# Patient Record
Sex: Female | Born: 1965 | Race: Black or African American | Hispanic: No | Smoking: Never smoker
Health system: Southern US, Community
[De-identification: ages and names within clinical notes are randomized; demographics above are authoritative.]

## PROBLEM LIST (undated history)

## (undated) DIAGNOSIS — T7840XA Allergy, unspecified, initial encounter: Secondary | ICD-10-CM

## (undated) HISTORY — PX: ADENOIDECTOMY: SUR15

## (undated) HISTORY — PX: TONSILLECTOMY: SUR1361

## (undated) HISTORY — DX: Allergy, unspecified, initial encounter: T78.40XA

---

## 1998-02-13 ENCOUNTER — Encounter (HOSPITAL_COMMUNITY): Admission: RE | Admit: 1998-02-13 | Discharge: 1998-03-10 | Payer: Self-pay | Admitting: Obstetrics and Gynecology

## 1998-03-06 ENCOUNTER — Inpatient Hospital Stay (HOSPITAL_COMMUNITY): Admission: AD | Admit: 1998-03-06 | Discharge: 1998-03-09 | Payer: Self-pay | Admitting: Obstetrics and Gynecology

## 1998-03-14 ENCOUNTER — Encounter: Admission: RE | Admit: 1998-03-14 | Discharge: 1998-06-12 | Payer: Self-pay | Admitting: Pediatrics

## 1998-07-14 ENCOUNTER — Encounter (HOSPITAL_COMMUNITY): Admission: RE | Admit: 1998-07-14 | Discharge: 1998-08-11 | Payer: Self-pay | Admitting: *Deleted

## 1999-07-07 ENCOUNTER — Ambulatory Visit (HOSPITAL_COMMUNITY): Admission: RE | Admit: 1999-07-07 | Discharge: 1999-07-07 | Payer: Self-pay | Admitting: Emergency Medicine

## 1999-07-09 ENCOUNTER — Encounter: Payer: Self-pay | Admitting: Emergency Medicine

## 2001-02-15 ENCOUNTER — Other Ambulatory Visit: Admission: RE | Admit: 2001-02-15 | Discharge: 2001-02-15 | Payer: Self-pay | Admitting: Gynecology

## 2002-03-23 ENCOUNTER — Other Ambulatory Visit: Admission: RE | Admit: 2002-03-23 | Discharge: 2002-03-23 | Payer: Self-pay | Admitting: Gynecology

## 2003-03-25 ENCOUNTER — Other Ambulatory Visit: Admission: RE | Admit: 2003-03-25 | Discharge: 2003-03-25 | Payer: Self-pay | Admitting: Gynecology

## 2003-11-13 ENCOUNTER — Encounter: Admission: RE | Admit: 2003-11-13 | Discharge: 2003-11-13 | Payer: Self-pay | Admitting: Gynecology

## 2004-12-02 ENCOUNTER — Other Ambulatory Visit: Admission: RE | Admit: 2004-12-02 | Discharge: 2004-12-02 | Payer: Self-pay | Admitting: Gynecology

## 2005-12-10 ENCOUNTER — Emergency Department (HOSPITAL_COMMUNITY): Admission: EM | Admit: 2005-12-10 | Discharge: 2005-12-11 | Payer: Self-pay | Admitting: Emergency Medicine

## 2005-12-15 ENCOUNTER — Other Ambulatory Visit: Admission: RE | Admit: 2005-12-15 | Discharge: 2005-12-15 | Payer: Self-pay | Admitting: Gynecology

## 2005-12-24 ENCOUNTER — Encounter: Admission: RE | Admit: 2005-12-24 | Discharge: 2005-12-24 | Payer: Self-pay | Admitting: Gynecology

## 2006-12-26 ENCOUNTER — Encounter: Admission: RE | Admit: 2006-12-26 | Discharge: 2006-12-26 | Payer: Self-pay | Admitting: Gynecology

## 2006-12-28 ENCOUNTER — Other Ambulatory Visit: Admission: RE | Admit: 2006-12-28 | Discharge: 2006-12-28 | Payer: Self-pay | Admitting: Gynecology

## 2007-12-29 ENCOUNTER — Encounter: Admission: RE | Admit: 2007-12-29 | Discharge: 2007-12-29 | Payer: Self-pay | Admitting: Gynecology

## 2009-01-15 ENCOUNTER — Encounter: Admission: RE | Admit: 2009-01-15 | Discharge: 2009-01-15 | Payer: Self-pay | Admitting: Gynecology

## 2010-01-12 ENCOUNTER — Encounter: Admission: RE | Admit: 2010-01-12 | Discharge: 2010-01-12 | Payer: Self-pay | Admitting: Gynecology

## 2011-02-10 ENCOUNTER — Other Ambulatory Visit: Payer: Self-pay | Admitting: Gynecology

## 2011-02-10 DIAGNOSIS — Z1231 Encounter for screening mammogram for malignant neoplasm of breast: Secondary | ICD-10-CM

## 2011-02-15 ENCOUNTER — Ambulatory Visit
Admission: RE | Admit: 2011-02-15 | Discharge: 2011-02-15 | Disposition: A | Discharge status: Home / Self Care | Payer: BC Managed Care – PPO | Source: Ambulatory Visit | Attending: Gynecology | Admitting: Gynecology

## 2011-02-15 DIAGNOSIS — Z1231 Encounter for screening mammogram for malignant neoplasm of breast: Secondary | ICD-10-CM

## 2012-01-05 ENCOUNTER — Other Ambulatory Visit: Payer: Self-pay | Admitting: Gynecology

## 2012-01-05 DIAGNOSIS — Z1231 Encounter for screening mammogram for malignant neoplasm of breast: Secondary | ICD-10-CM

## 2012-02-21 ENCOUNTER — Ambulatory Visit: Payer: BC Managed Care – PPO

## 2012-03-29 ENCOUNTER — Ambulatory Visit
Admission: RE | Admit: 2012-03-29 | Discharge: 2012-03-29 | Disposition: A | Discharge status: Home / Self Care | Payer: BC Managed Care – PPO | Source: Ambulatory Visit | Attending: Gynecology | Admitting: Gynecology

## 2012-03-29 DIAGNOSIS — Z1231 Encounter for screening mammogram for malignant neoplasm of breast: Secondary | ICD-10-CM

## 2013-04-10 ENCOUNTER — Other Ambulatory Visit: Payer: Self-pay

## 2013-04-10 DIAGNOSIS — Z1231 Encounter for screening mammogram for malignant neoplasm of breast: Secondary | ICD-10-CM

## 2013-04-18 ENCOUNTER — Ambulatory Visit
Admission: RE | Admit: 2013-04-18 | Discharge: 2013-04-18 | Disposition: A | Discharge status: Home / Self Care | Payer: BC Managed Care – PPO | Source: Ambulatory Visit

## 2013-04-18 DIAGNOSIS — Z1231 Encounter for screening mammogram for malignant neoplasm of breast: Secondary | ICD-10-CM

## 2013-11-26 HISTORY — PX: COLONOSCOPY: SHX174

## 2014-06-07 ENCOUNTER — Emergency Department (HOSPITAL_BASED_OUTPATIENT_CLINIC_OR_DEPARTMENT_OTHER)
Admission: EM | Admit: 2014-06-07 | Discharge: 2014-06-07 | Disposition: A | Discharge status: Home / Self Care | Payer: BC Managed Care – PPO | Attending: Emergency Medicine | Admitting: Emergency Medicine

## 2014-06-07 ENCOUNTER — Emergency Department (HOSPITAL_BASED_OUTPATIENT_CLINIC_OR_DEPARTMENT_OTHER): Payer: BC Managed Care – PPO

## 2014-06-07 ENCOUNTER — Encounter (HOSPITAL_BASED_OUTPATIENT_CLINIC_OR_DEPARTMENT_OTHER): Payer: Self-pay | Admitting: Emergency Medicine

## 2014-06-07 DIAGNOSIS — Z9889 Other specified postprocedural states: Secondary | ICD-10-CM | POA: Insufficient documentation

## 2014-06-07 DIAGNOSIS — Z3202 Encounter for pregnancy test, result negative: Secondary | ICD-10-CM | POA: Insufficient documentation

## 2014-06-07 DIAGNOSIS — R1031 Right lower quadrant pain: Secondary | ICD-10-CM | POA: Insufficient documentation

## 2014-06-07 DIAGNOSIS — R109 Unspecified abdominal pain: Secondary | ICD-10-CM

## 2014-06-07 LAB — PREGNANCY, URINE: Preg Test, Ur: NEGATIVE

## 2014-06-07 LAB — COMPREHENSIVE METABOLIC PANEL
ALT: 8 U/L (ref 0–35)
AST: 13 U/L (ref 0–37)
Albumin: 4.1 g/dL (ref 3.5–5.2)
Alkaline Phosphatase: 57 U/L (ref 39–117)
BILIRUBIN TOTAL: 0.4 mg/dL (ref 0.3–1.2)
BUN: 12 mg/dL (ref 6–23)
CHLORIDE: 103 meq/L (ref 96–112)
CO2: 24 meq/L (ref 19–32)
CREATININE: 0.8 mg/dL (ref 0.50–1.10)
Calcium: 9.5 mg/dL (ref 8.4–10.5)
GFR, EST NON AFRICAN AMERICAN: 87 mL/min — AB (ref >90)
GLUCOSE: 98 mg/dL (ref 70–99)
Potassium: 4.1 mEq/L (ref 3.7–5.3)
Sodium: 137 mEq/L (ref 137–147)
Total Protein: 7.7 g/dL (ref 6.0–8.3)

## 2014-06-07 LAB — CBC WITH DIFFERENTIAL/PLATELET
BASOS ABS: 0 10*3/uL (ref 0.0–0.1)
Basophils Relative: 0 % (ref 0–1)
Eosinophils Absolute: 0 10*3/uL (ref 0.0–0.7)
Eosinophils Relative: 0 % (ref 0–5)
HEMATOCRIT: 39.1 % (ref 36.0–46.0)
HEMOGLOBIN: 13.2 g/dL (ref 12.0–15.0)
LYMPHS ABS: 1.1 10*3/uL (ref 0.7–4.0)
LYMPHS PCT: 21 % (ref 12–46)
MCH: 28.6 pg (ref 26.0–34.0)
MCHC: 33.8 g/dL (ref 30.0–36.0)
MCV: 84.8 fL (ref 78.0–100.0)
MONO ABS: 0.4 10*3/uL (ref 0.1–1.0)
MONOS PCT: 7 % (ref 3–12)
NEUTROS ABS: 3.8 10*3/uL (ref 1.7–7.7)
Neutrophils Relative %: 72 % (ref 43–77)
Platelets: 214 10*3/uL (ref 150–400)
RBC: 4.61 MIL/uL (ref 3.87–5.11)
RDW: 12.6 % (ref 11.5–15.5)
WBC: 5.3 10*3/uL (ref 4.0–10.5)

## 2014-06-07 LAB — URINALYSIS, ROUTINE W REFLEX MICROSCOPIC
Bilirubin Urine: NEGATIVE
GLUCOSE, UA: NEGATIVE mg/dL
HGB URINE DIPSTICK: NEGATIVE
Ketones, ur: NEGATIVE mg/dL
LEUKOCYTES UA: NEGATIVE
Nitrite: NEGATIVE
PH: 5.5 (ref 5.0–8.0)
Protein, ur: NEGATIVE mg/dL
Specific Gravity, Urine: 1.024 (ref 1.005–1.030)
Urobilinogen, UA: 0.2 mg/dL (ref 0.0–1.0)

## 2014-06-07 LAB — LIPASE, BLOOD: Lipase: 28 U/L (ref 11–59)

## 2014-06-07 MED ORDER — IOHEXOL 300 MG/ML  SOLN
100.0000 mL | Freq: Once | INTRAMUSCULAR | Status: AC | PRN
  Administered 2014-06-07: 100 mL via INTRAVENOUS

## 2014-06-07 MED ORDER — SODIUM CHLORIDE 0.9 % IV BOLUS (SEPSIS)
1000.0000 mL | Freq: Once | INTRAVENOUS | Status: AC
  Administered 2014-06-07: 1000 mL via INTRAVENOUS

## 2014-06-07 MED ORDER — IOHEXOL 300 MG/ML  SOLN
50.0000 mL | Freq: Once | INTRAMUSCULAR | Status: AC | PRN
  Administered 2014-06-07: 50 mL via ORAL

## 2014-06-07 NOTE — ED Notes (Signed)
Right sided abdominal pain that started 0400 this am and progressively worsening throughout the day.  Nausea.  Denies fever, vomiting or diarrhea.

## 2014-06-07 NOTE — ED Provider Notes (Signed)
CSN: [id number]     Arrival date & time 06/07/14  1113 History   First MD Initiated Contact with Patient 06/07/14 1151     Chief Complaint  Patient presents with  . Abdominal Pain     (Consider location/radiation/quality/duration/timing/severity/associated sxs/prior Treatment) HPI Comments: Patient is a 48 year old female with no significant past medical history. She presents with complaints of pain in the right lower quadrant that started in the night and has worsened throughout the day. She denies any fevers or chills. She feels nauseated but denies vomiting. She denies diarrhea or constipation. Her last menstrual period was 2 months ago, however that is not abnormal for her. She denies the possibility of pregnancy.  Patient is a 48 y.o. female presenting with abdominal pain. The history is provided by the patient.  Abdominal Pain Pain location:  RLQ Pain quality: cramping   Pain radiates to:  Does not radiate Pain severity:  Moderate Onset quality:  Gradual Duration:  12 hours Timing:  Constant Progression:  Worsening Chronicity:  New Relieved by:  Nothing Worsened by:  Movement and palpation   History reviewed. No pertinent past medical history. Past Surgical History  Procedure Laterality Date  . Cesarean section    . Colonoscopy  11-2013    normal   No family history on file. History  Substance Use Topics  . Smoking status: Never Smoker   . Smokeless tobacco: Not on file  . Alcohol Use: No   OB History   Grav Para Term Preterm Abortions TAB SAB Ect Mult Living                 Review of Systems  Gastrointestinal: Positive for abdominal pain.  All other systems reviewed and are negative.     Allergies  Shellfish allergy  Home Medications   Prior to Admission medications   Not on File   BP 142/86  Pulse 81  Temp(Src) 98 F (36.7 C) (Oral)  Resp 20  Ht 4\' 11"  (1.499 m)  Wt 116 lb (52.617 kg)  BMI 23.42 kg/m2  SpO2 100%  LMP 05/07/2014 Physical  Exam  Nursing note and vitals reviewed. Constitutional: She is oriented to person, place, and time. She appears well-developed and well-nourished. No distress.  HENT:  Head: Normocephalic and atraumatic.  Neck: Normal range of motion. Neck supple.  Cardiovascular: Normal rate and regular rhythm.  Exam reveals no gallop and no friction rub.   No murmur heard. Pulmonary/Chest: Effort normal and breath sounds normal. No respiratory distress. She has no wheezes.  Abdominal: Soft. Bowel sounds are normal. She exhibits no distension. There is tenderness. There is no rebound and no guarding.  There is tenderness to palpation in the right lower quadrant with no rebound and no guarding. Bowel sounds are present.  Musculoskeletal: Normal range of motion.  Neurological: She is alert and oriented to person, place, and time.  Skin: Skin is warm and dry. She is not diaphoretic.    ED Course  Procedures (including critical care time) Labs Review Labs Reviewed  PREGNANCY, URINE  URINALYSIS, ROUTINE W REFLEX MICROSCOPIC  CBC WITH DIFFERENTIAL  COMPREHENSIVE METABOLIC PANEL  LIPASE, BLOOD    Imaging Review No results found.   EKG Interpretation None      MDM   Final diagnoses:  None    Patient presents with complaints of right-sided abdominal pain that started early this a.m. She is tender to palpation in the right lower quadrant and right midabdomen. Laboratory studies showed no elevation of  white count and urinalysis is clear. Pregnancy test is negative. CT scan shows no evidence for any acute pathology. She states she is feeling better and has declined any pain medication. I am uncertain as to the exact etiology of her discomfort, however nothing appears emergent or surgical. She will be discharged with instructions to return if she develops worsening pain, high fever, bloody stool, or any other new symptoms.    Geoffery Lyonsouglas Usiel Astarita, MD 06/07/14 1440

## 2014-06-07 NOTE — ED Notes (Signed)
Pt is drinking PO contrast.  Family at bedside.

## 2014-06-07 NOTE — Discharge Instructions (Signed)
Ibuprofen 400 mg every 6 hours as needed for pain.  Return to the emergency department if you develop worsening pain, high fever, bloody stool, or other new and concerning symptoms.   Abdominal Pain, Adult Many things can cause abdominal pain. Usually, abdominal pain is not caused by a disease and will improve without treatment. It can often be observed and treated at home. Your health care provider will do a physical exam and possibly order blood tests and X-rays to help determine the seriousness of your pain. However, in many cases, more time must pass before a clear cause of the pain can be found. Before that point, your health care provider may not know if you need more testing or further treatment. HOME CARE INSTRUCTIONS  Monitor your abdominal pain for any changes. The following actions may help to alleviate any discomfort you are experiencing:  Only take over-the-counter or prescription medicines as directed by your health care provider.  Do not take laxatives unless directed to do so by your health care provider.  Try a clear liquid diet (broth, tea, or water) as directed by your health care provider. Slowly move to a bland diet as tolerated. SEEK MEDICAL CARE IF:  You have unexplained abdominal pain.  You have abdominal pain associated with nausea or diarrhea.  You have pain when you urinate or have a bowel movement.  You experience abdominal pain that wakes you in the night.  You have abdominal pain that is worsened or improved by eating food.  You have abdominal pain that is worsened with eating fatty foods. SEEK IMMEDIATE MEDICAL CARE IF:   Your pain does not go away within 2 hours.  You have a fever.  You keep throwing up (vomiting).  Your pain is felt only in portions of the abdomen, such as the right side or the left lower portion of the abdomen.  You pass bloody or black tarry stools. MAKE SURE YOU:  Understand these instructions.   Will watch your  condition.   Will get help right away if you are not doing well or get worse.  Document Released: 09/22/2005 Document Revised: 10/03/2013 Document Reviewed: 08/22/2013 [facility] Patient Information 2014 [employer], [place].

## 2016-02-27 ENCOUNTER — Other Ambulatory Visit (HOSPITAL_COMMUNITY): Payer: Self-pay | Admitting: Otolaryngology

## 2016-02-27 DIAGNOSIS — R599 Enlarged lymph nodes, unspecified: Secondary | ICD-10-CM

## 2016-03-01 ENCOUNTER — Ambulatory Visit (HOSPITAL_COMMUNITY)
Admission: RE | Admit: 2016-03-01 | Discharge: 2016-03-01 | Disposition: A | Discharge status: Home / Self Care | Payer: BC Managed Care – PPO | Source: Ambulatory Visit | Attending: Otolaryngology | Admitting: Otolaryngology

## 2016-03-01 DIAGNOSIS — R591 Generalized enlarged lymph nodes: Secondary | ICD-10-CM | POA: Insufficient documentation

## 2016-03-01 DIAGNOSIS — R599 Enlarged lymph nodes, unspecified: Secondary | ICD-10-CM

## 2018-05-16 ENCOUNTER — Encounter (HOSPITAL_COMMUNITY): Payer: Self-pay | Admitting: Emergency Medicine

## 2018-05-16 ENCOUNTER — Ambulatory Visit (HOSPITAL_COMMUNITY)
Admission: EM | Admit: 2018-05-16 | Discharge: 2018-05-16 | Disposition: A | Discharge status: Home / Self Care | Payer: BC Managed Care – PPO | Attending: Family Medicine | Admitting: Family Medicine

## 2018-05-16 DIAGNOSIS — R0789 Other chest pain: Secondary | ICD-10-CM

## 2018-05-16 NOTE — ED Provider Notes (Signed)
[facility]    CSN: [id number] Arrival date & time: 05/16/18  1201     History   Chief Complaint Chief Complaint  Patient presents with  . Chest Pain    HPI Mary Golden is a 52 y.o. female no contributing past medical history presenting today for evaluation of chest discomfort.  Patient states that she began to have "pinging" sensations in her chest beginning this morning.  Pinging only lasted approximately 1 second, but continues to experience this frequently.  She also noted that she had some right arm numbness.  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  Does not take medication daily for any medical issues.  Denies history of hypertension and diabetes.  Denies family history of dying from early age from an MI.  She does note her neck is slightly stiff.  Drinks approximately half Cup of coffee every other day.  Denies associated changes in vision, nausea, vomiting, dizziness, shortness of breath.  Denies abdominal pain.  HPI  History reviewed. No pertinent past medical history.  There are no active problems to display for this patient.   Past Surgical History:  Procedure Laterality Date  . CESAREAN SECTION    . COLONOSCOPY  11-2013   normal    OB History   None      Home Medications    Prior to Admission medications   Not on File    Family History History reviewed. No pertinent family history.  Social History Social History   Tobacco Use  . Smoking status: Never Smoker  Substance Use Topics  . Alcohol use: No  . Drug use: No     Allergies   Shellfish allergy   Review of Systems Review of Systems  Constitutional: Negative for activity change, appetite change, fatigue and fever.  HENT: Negative for congestion and trouble swallowing.   Eyes: Negative for pain and visual disturbance.  Respiratory: Negative for cough and shortness of breath.   Cardiovascular: Positive for chest pain.  Gastrointestinal:  Negative for abdominal pain, nausea and vomiting.  Musculoskeletal: Positive for myalgias and neck pain. Negative for arthralgias, back pain, gait problem and neck stiffness.  Skin: Negative for color change and wound.  Neurological: Negative for dizziness, seizures, syncope, weakness, light-headedness, numbness and headaches.     Physical Exam Triage Vital Signs ED Triage Vitals [05/16/18 1247]  Enc Vitals Group     BP 121/79     Pulse Rate 87     Resp 18     Temp 98.5 F (36.9 C)     Temp Source Oral     SpO2 100 %     Weight      Height      Head Circumference      Peak Flow      Pain Score      Pain Loc      Pain Edu?      Excl. in GC?    No data found.  Updated Vital Signs BP 121/79 (BP Location: Left Arm)   Pulse 87   Temp 98.5 F (36.9 C) (Oral)   Resp 18   SpO2 100%   Visual Acuity Right Eye Distance:   Left Eye Distance:   Bilateral Distance:    Right Eye Near:   Left Eye Near:    Bilateral Near:     Physical Exam  Constitutional: She is oriented to person, place, and time. She appears well-developed and well-nourished. No distress.  HENT:  Head: Normocephalic and atraumatic.  Mouth/Throat: Oropharynx is clear and moist.  Eyes: Pupils are equal, round, and reactive to light. Conjunctivae and EOM are normal.  Neck: Neck supple.  Cardiovascular: Normal rate and regular rhythm.  No murmur heard. Pulmonary/Chest: Effort normal and breath sounds normal. No respiratory distress.  Breathing comfortably at rest, CTABL, no wheezing, rales or other adventitious sounds auscultated  discomfort not reproducible with palpation of chest  Abdominal: Soft. There is no tenderness.  Musculoskeletal: She exhibits no edema.  Neurological: She is alert and oriented to person, place, and time.  Skin: Skin is warm and dry.  Psychiatric: She has a normal mood and affect.  Nursing note and vitals reviewed.    UC Treatments / Results  Labs (all labs ordered are  listed, but only abnormal results are displayed) Labs Reviewed - No data to display  EKG None  Radiology No results found.  Procedures Procedures (including critical care time)  Medications Ordered in UC Medications - No data to display  Initial Impression / Assessment and Plan / UC Course  I have reviewed the triage vital signs and the nursing notes.  Pertinent labs & imaging results that were available during my care of the patient were reviewed by me and considered in my medical decision making (see chart for details).     Patient with atypical chest discomfort, EKG normal sinus rhythm, and no signs of ischemia or infarction.  Vital signs stable.  Patient low risk for MI.  Possibly related to PVCs although these were not seen on EKG.  Patient with not having pinging during EKG.  Do not feel patient needs emergent work-up of this at this time.  Discussed establishing care with a PCP.Discussed strict return precautions. Patient verbalized understanding and is agreeable with plan.  Final Clinical Impressions(s) / UC Diagnoses   Final diagnoses:  Atypical chest pain     Discharge Instructions     Please set up care with a PCP  Return if symptoms worsening or developing new symptoms   ED Prescriptions    None     Controlled Substance Prescriptions [place] Controlled Substance Registry consulted? Not Applicable   Lew Dawes, [place] 05/16/18 1404

## 2018-05-16 NOTE — ED Triage Notes (Signed)
Pt here for mid sternal CP intermittent x 1 week that was worse this morning

## 2018-05-16 NOTE — Discharge Instructions (Signed)
Please set up care with a PCP  Return if symptoms worsening or developing new symptoms

## 2019-07-30 LAB — HM MAMMOGRAPHY

## 2019-07-31 LAB — HM PAP SMEAR: HM Pap smear: NEGATIVE

## 2020-07-08 ENCOUNTER — Telehealth: Payer: Self-pay | Admitting: General Practice

## 2020-07-08 NOTE — Telephone Encounter (Signed)
Received online request to schedule new patient appt, prefers Alysia Penna on Friday morning. Left voicemail for patient to call the office to schedule.

## 2020-08-15 ENCOUNTER — Ambulatory Visit: Payer: BC Managed Care – PPO | Admitting: Nurse Practitioner

## 2020-08-26 ENCOUNTER — Other Ambulatory Visit: Payer: Self-pay

## 2020-08-26 ENCOUNTER — Ambulatory Visit (INDEPENDENT_AMBULATORY_CARE_PROVIDER_SITE_OTHER): Payer: BC Managed Care – PPO | Admitting: Nurse Practitioner

## 2020-08-26 ENCOUNTER — Encounter: Payer: Self-pay | Admitting: Nurse Practitioner

## 2020-08-26 VITALS — BP 120/86 | HR 84 | Temp 97.3°F | Ht 58.5 in | Wt 124.6 lb

## 2020-08-26 DIAGNOSIS — Z800 Family history of malignant neoplasm of digestive organs: Secondary | ICD-10-CM | POA: Insufficient documentation

## 2020-08-26 DIAGNOSIS — Z0000 Encounter for general adult medical examination without abnormal findings: Secondary | ICD-10-CM

## 2020-08-26 DIAGNOSIS — Z13220 Encounter for screening for lipoid disorders: Secondary | ICD-10-CM | POA: Diagnosis not present

## 2020-08-26 DIAGNOSIS — N951 Menopausal and female climacteric states: Secondary | ICD-10-CM | POA: Insufficient documentation

## 2020-08-26 DIAGNOSIS — Z1231 Encounter for screening mammogram for malignant neoplasm of breast: Secondary | ICD-10-CM | POA: Diagnosis not present

## 2020-08-26 DIAGNOSIS — Z91013 Allergy to seafood: Secondary | ICD-10-CM

## 2020-08-26 DIAGNOSIS — Z136 Encounter for screening for cardiovascular disorders: Secondary | ICD-10-CM | POA: Diagnosis not present

## 2020-08-26 LAB — COMPREHENSIVE METABOLIC PANEL
ALT: 13 U/L (ref 0–35)
AST: 17 U/L (ref 0–37)
Albumin: 4.5 g/dL (ref 3.5–5.2)
Alkaline Phosphatase: 67 U/L (ref 39–117)
BUN: 14 mg/dL (ref 6–23)
CO2: 29 mEq/L (ref 19–32)
Calcium: 9.7 mg/dL (ref 8.4–10.5)
Chloride: 104 mEq/L (ref 96–112)
Creatinine, Ser: 0.7 mg/dL (ref 0.40–1.20)
GFR: 106.15 mL/min (ref >60.00)
Glucose, Bld: 87 mg/dL (ref 70–99)
Potassium: 3.8 mEq/L (ref 3.5–5.1)
Sodium: 139 mEq/L (ref 135–145)
Total Bilirubin: 0.3 mg/dL (ref 0.2–1.2)
Total Protein: 7.7 g/dL (ref 6.0–8.3)

## 2020-08-26 LAB — CBC WITH DIFFERENTIAL/PLATELET
Basophils Absolute: 0 10*3/uL (ref 0.0–0.1)
Basophils Relative: 0.4 % (ref 0.0–3.0)
Eosinophils Absolute: 0.1 10*3/uL (ref 0.0–0.7)
Eosinophils Relative: 1.1 % (ref 0.0–5.0)
HCT: 40.5 % (ref 36.0–46.0)
Hemoglobin: 13 g/dL (ref 12.0–15.0)
Lymphocytes Relative: 35.6 % (ref 12.0–46.0)
Lymphs Abs: 1.6 10*3/uL (ref 0.7–4.0)
MCHC: 32.2 g/dL (ref 30.0–36.0)
MCV: 82.3 fl (ref 78.0–100.0)
Monocytes Absolute: 0.3 10*3/uL (ref 0.1–1.0)
Monocytes Relative: 6.8 % (ref 3.0–12.0)
Neutro Abs: 2.5 10*3/uL (ref 1.4–7.7)
Neutrophils Relative %: 56.1 % (ref 43.0–77.0)
Platelets: 223 10*3/uL (ref 150.0–400.0)
RBC: 4.92 Mil/uL (ref 3.87–5.11)
RDW: 13.8 % (ref 11.5–15.5)
WBC: 4.4 10*3/uL (ref 4.0–10.5)

## 2020-08-26 LAB — LIPID PANEL
Cholesterol: 198 mg/dL (ref 0–200)
HDL: 57.8 mg/dL (ref >39.00)
LDL Cholesterol: 121 mg/dL — ABNORMAL HIGH (ref 0–99)
NonHDL: 140.37
Total CHOL/HDL Ratio: 3
Triglycerides: 95 mg/dL (ref 0.0–149.0)
VLDL: 19 mg/dL (ref 0.0–40.0)

## 2020-08-26 LAB — TSH: TSH: 1.55 u[IU]/mL (ref 0.35–4.50)

## 2020-08-26 MED ORDER — EPINEPHRINE 0.3 MG/0.3ML IJ SOAJ: 0.3000 mg | INTRAMUSCULAR | 1 refills | Status: DC | PRN

## 2020-08-26 NOTE — Patient Instructions (Addendum)
Go to lab for blood draw. You will be contacted to schedule appt for mammogram  Please sign medical release forms to get records from GI and GYN.  Thank you for choosing [facility] for your health needs.   Preventive Care 52-52 Years Oldears Old, Female Preventive care refers to visits with your health care provider and lifestyle choices that can promote health and wellness. This includes:  A yearly physical exam. This may also be called an annual well check.  Regular dental visits and eye exams.  Immunizations.  Screening for certain conditions.  Healthy lifestyle choices, such as eating a healthy diet, getting regular exercise, not using drugs or products that contain nicotine and tobacco, and limiting alcohol use. What can I expect for my preventive care visit? Physical exam Your health care provider will check your:  Height and weight. This may be used to calculate body mass index (BMI), which tells if you are at a healthy weight.  Heart rate and blood pressure.  Skin for abnormal spots. Counseling Your health care provider may ask you questions about your:  Alcohol, tobacco, and drug use.  Emotional well-being.  Home and relationship well-being.  Sexual activity.  Eating habits.  Work and work Astronomer.  Method of birth control.  Menstrual cycle.  Pregnancy history. What immunizations do I need?  Influenza (flu) vaccine  This is recommended every year. Tetanus, diphtheria, and pertussis (Tdap) vaccine  You may need a Td booster every 10 years. Varicella (chickenpox) vaccine  You may need this if you have not been vaccinated. Zoster (shingles) vaccine  You may need this after age 52. Measles, mumps, and rubella (MMR) vaccine  You may need at least one dose of MMR if you were born in 1957 or later. You may also need a second dose. Pneumococcal conjugate (PCV13) vaccine  You may need this if you have certain conditions and were not previously  vaccinated. Pneumococcal polysaccharide (PPSV23) vaccine  You may need one or two doses if you smoke cigarettes or if you have certain conditions. Meningococcal conjugate (MenACWY) vaccine  You may need this if you have certain conditions. Hepatitis A vaccine  You may need this if you have certain conditions or if you travel or work in places where you may be exposed to hepatitis A. Hepatitis B vaccine  You may need this if you have certain conditions or if you travel or work in places where you may be exposed to hepatitis B. Haemophilus influenzae type b (Hib) vaccine  You may need this if you have certain conditions. Human papillomavirus (HPV) vaccine  If recommended by your health care provider, you may need three doses over 6 months. You may receive vaccines as individual doses or as more than one vaccine together in one shot (combination vaccines). Talk with your health care provider about the risks and benefits of combination vaccines. What tests do I need? Blood tests  Lipid and cholesterol levels. These may be checked every 5 years, or more frequently if you are over 52 years old.  Hepatitis C test.  Hepatitis B test. Screening  Lung cancer screening. You may have this screening every year starting at age 52 if you have a 30-pack-year history of smoking and currently smoke or have quit within the past 15 years.  Colorectal cancer screening. All adults should have this screening starting at age 52 and continuing until age 52. Your health care provider may recommend screening at age 90 if you are at increased risk.  You will have tests every 1-10 years, depending on your results and the type of screening test.  Diabetes screening. This is done by checking your blood sugar (glucose) after you have not eaten for a while (fasting). You may have this done every 1-3 years.  Mammogram. This may be done every 52-2 years. Talk with your health care provider about when you should start  having regular mammograms. This may depend on whether you have a family history of breast cancer.  BRCA-related cancer screening. This may be done if you have a family history of breast, ovarian, tubal, or peritoneal cancers.  Pelvic exam and Pap test. This may be done every 3 years starting at age 52. Starting at age 52, this may be done every 5 years if you have a Pap test in combination with an HPV test. Other tests  Sexually transmitted disease (STD) testing.  Bone density scan. This is done to screen for osteoporosis. You may have this scan if you are at high risk for osteoporosis. Follow these instructions at home: Eating and drinking  Eat a diet that includes fresh fruits and vegetables, whole grains, lean protein, and low-fat dairy.  Take vitamin and mineral supplements as recommended by your health care provider.  Do not drink alcohol if: ? Your health care provider tells you not to drink. ? You are pregnant, may be pregnant, or are planning to become pregnant.  If you drink alcohol: ? Limit how much you have to 0-1 drink a day. ? Be aware of how much alcohol is in your drink. In the [place] one drink equals one 12 oz bottle of beer (355 mL), one 5 oz glass of wine (148 mL), or one 1 oz glass of hard liquor (44 mL). Lifestyle  Take daily care of your teeth and gums.  Stay active. Exercise for at least 30 minutes on 5 or more days each week.  Do not use any products that contain nicotine or tobacco, such as cigarettes, e-cigarettes, and chewing tobacco. If you need help quitting, ask your health care provider.  If you are sexually active, practice safe sex. Use a condom or other form of birth control (contraception) in order to prevent pregnancy and STIs (sexually transmitted infections).  If told by your health care provider, take low-dose aspirin daily starting at age 14. What's next?  Visit your health care provider once a year for a well check visit.  Ask your health  care provider how often you should have your eyes and teeth checked.  Stay up to date on all vaccines. This information is not intended to replace advice given to you by your health care provider. Make sure you discuss any questions you have with your health care provider. Document Revised: 08/24/2018 Document Reviewed: 08/24/2018 Elsevier Patient Education  2020 [employer].

## 2020-08-26 NOTE — Progress Notes (Signed)
Subjective:    Patient ID: Mary Golden, female    DOB: 09/15/1966, 54 y.o.   MRN: [id number]  Patient presents today for CPE (new patient)  HPI  Sexual History (orientation,birth control, marital status, STD):divorced, not sexually active, up to date with pelvic exam, no hx of abnormal PAP, needs order for annual mammogram.  Depression/Suicide: no need for medication of counseling at this time. Depression screen PHQ 2/9 08/26/2020  Decreased Interest 1  Down, Depressed, Hopeless 1  PHQ - 2 Score 2  Altered sleeping 2  Tired, decreased energy 1  Change in appetite 0  Feeling bad or failure about yourself  0  Trouble concentrating 2  Moving slowly or fidgety/restless 0  Suicidal thoughts 0  PHQ-9 Score 7  Difficult doing work/chores Not difficult at all   GAD 7 : Generalized Anxiety Score 08/26/2020  Nervous, Anxious, on Edge 0  Control/stop worrying 0  Worry too much - different things 1  Trouble relaxing 0  Restless 0  Easily annoyed or irritable 0  Afraid - awful might happen 0  Total GAD 7 Score 1  Anxiety Difficulty Not difficult at all   Vision:up to date  Dental:up to date  Immunizations: (TDAP, Hep C screen, Pneumovax, Influenza, zoster)  Health Maintenance  Topic Date Due  .  Hepatitis C: One time screening is recommended by Center for Disease Control  (CDC) for  adults born from [birth year] through 1963.   Never done  . HIV Screening  Never done  . Tetanus Vaccine  Never done  . Pap Smear  Never done  . Mammogram  03/28/2018  . Colon Cancer Screening  Never done  . Flu Shot  07/27/2020  . COVID-19 Vaccine  Completed   Diet:heart healthy. Exercise: daily cardio  Weight:  Wt Readings from Last 3 Encounters:  08/26/20 124 lb 9.6 oz (56.5 kg)  06/07/14 116 lb (52.6 kg)   Fall Risk:no fall risk  Medications and allergies reviewed with patient and updated if appropriate.  There are no problems to display for this patient.   Current Outpatient  Medications on File Prior to Visit  Medication Sig Dispense Refill  . Multiple Vitamin (MULTIVITAMIN) tablet Take 1 tablet by mouth daily.     No current facility-administered medications on file prior to visit.    History reviewed. No pertinent past medical history.  Past Surgical History:  Procedure Laterality Date  . CESAREAN SECTION    . COLONOSCOPY  11-2013   normal    Social History   Socioeconomic History  . Marital status: Divorced    Spouse name: Not on file  . Number of children: 2  . Years of education: Not on file  . Highest education level: Not on file  Occupational History  . Not on file  Tobacco Use  . Smoking status: Never Smoker  . Smokeless tobacco: Never Used  Substance and Sexual Activity  . Alcohol use: No  . Drug use: No  . Sexual activity: Yes    Birth control/protection: Post-menopausal  Other Topics Concern  . Not on file  Social History Narrative  . Not on file   Social Determinants of Health   Financial Resource Strain:   . Difficulty of Paying Living Expenses: Not on file  Food Insecurity:   . Worried About Programme researcher, broadcasting/film/video in the Last Year: Not on file  . Ran Out of Food in the Last Year: Not on file  Transportation Needs:   . Lack  of Transportation (Medical): Not on file  . Lack of Transportation (Non-Medical): Not on file  Physical Activity:   . Days of Exercise per Week: Not on file  . Minutes of Exercise per Session: Not on file  Stress:   . Feeling of Stress : Not on file  Social Connections:   . Frequency of Communication with Friends and Family: Not on file  . Frequency of Social Gatherings with Friends and Family: Not on file  . Attends Religious Services: Not on file  . Active Member of Clubs or Organizations: Not on file  . Attends Banker Meetings: Not on file  . Marital Status: Not on file    Family History  Problem Relation Age of Onset  . Cancer Mother 66       colon  . Cancer Father         lung        Review of Systems  Constitutional: Negative for fever, malaise/fatigue and weight loss.  HENT: Negative for congestion and sore throat.   Eyes:       Negative for visual changes  Respiratory: Negative for cough and shortness of breath.   Cardiovascular: Negative for chest pain, palpitations and leg swelling.  Gastrointestinal: Negative for blood in stool, constipation, diarrhea and heartburn.  Genitourinary: Negative for dysuria, frequency and urgency.  Musculoskeletal: Negative for falls, joint pain and myalgias.  Skin: Negative for rash.  Neurological: Negative for dizziness, sensory change and headaches.  Endo/Heme/Allergies: Does not bruise/bleed easily.  Psychiatric/Behavioral: Negative for depression, substance abuse and suicidal ideas. The patient is not nervous/anxious.     Objective:   Vitals:   08/26/20 1303  BP: 120/86  Pulse: 84  Temp: (!) 97.3 F (36.3 C)  SpO2: 98%    Body mass index is 25.6 kg/m.   Physical Examination:  Physical Exam Vitals reviewed.  Constitutional:      General: She is not in acute distress.    Appearance: She is well-developed.  HENT:     Right Ear: Tympanic membrane, ear canal and external ear normal.     Left Ear: Tympanic membrane, ear canal and external ear normal.  Eyes:     Extraocular Movements: Extraocular movements intact.     Conjunctiva/sclera: Conjunctivae normal.  Cardiovascular:     Rate and Rhythm: Normal rate and regular rhythm.     Pulses: Normal pulses.     Heart sounds: Normal heart sounds.  Pulmonary:     Effort: Pulmonary effort is normal. No respiratory distress.     Breath sounds: Normal breath sounds.  Chest:     Chest wall: No tenderness.  Abdominal:     General: Bowel sounds are normal.     Palpations: Abdomen is soft.  Genitourinary:    Comments: Deferred to next year per patient Musculoskeletal:        General: Normal range of motion.     Cervical back: Normal range of motion  and neck supple.     Right lower leg: No edema.     Left lower leg: No edema.  Lymphadenopathy:     Cervical: No cervical adenopathy.  Skin:    General: Skin is warm and dry.  Neurological:     Mental Status: She is alert and oriented to person, place, and time.     Deep Tendon Reflexes: Reflexes are normal and symmetric.  Psychiatric:        Mood and Affect: Mood normal.  Behavior: Behavior normal.        Thought Content: Thought content normal.     ASSESSMENT and PLAN: This visit occurred during the SARS-CoV-2 public health emergency.  Safety protocols were in place, including screening questions prior to the visit, additional usage of staff PPE, and extensive cleaning of exam room while observing appropriate contact time as indicated for disinfecting solutions.   Jakyrah was seen today for establish care.  Diagnoses and all orders for this visit:  Preventative health care -     CBC with Differential/Platelet -     Comprehensive metabolic panel -     Lipid panel -     TSH  Encounter for lipid screening for cardiovascular disease -     Lipid panel  Breast cancer screening by mammogram -     MM DIGITAL SCREENING BILATERAL; Future  History of allergy to shellfish -     EPINEPHrine 0.3 mg/0.3 mL IJ SOAJ injection; Inject 0.3 mLs (0.3 mg total) into the muscle as needed for anaphylaxis.        Problem List Items Addressed This Visit    None    Visit Diagnoses    Preventative health care    -  Primary   Relevant Orders   CBC with Differential/Platelet   Comprehensive metabolic panel   Lipid panel   TSH   Encounter for lipid screening for cardiovascular disease       Relevant Orders   Lipid panel   Breast cancer screening by mammogram       Relevant Orders   MM DIGITAL SCREENING BILATERAL   History of allergy to shellfish       Relevant Medications   EPINEPHrine 0.3 mg/0.3 mL IJ SOAJ injection      Follow up: Return in about 1 year (around 08/26/2021)  for CPE (fasting).  Alysia Penna, NP

## 2020-09-22 ENCOUNTER — Encounter: Payer: Self-pay | Admitting: Nurse Practitioner

## 2020-09-22 NOTE — Progress Notes (Signed)
Abstracted result and sent to scan  

## 2020-09-23 ENCOUNTER — Other Ambulatory Visit: Payer: Self-pay

## 2020-09-23 ENCOUNTER — Ambulatory Visit
Admission: RE | Admit: 2020-09-23 | Discharge: 2020-09-23 | Disposition: A | Discharge status: Home / Self Care | Payer: BC Managed Care – PPO | Source: Ambulatory Visit | Attending: Nurse Practitioner | Admitting: Nurse Practitioner

## 2020-09-23 DIAGNOSIS — Z1231 Encounter for screening mammogram for malignant neoplasm of breast: Secondary | ICD-10-CM

## 2021-06-01 ENCOUNTER — Other Ambulatory Visit: Payer: Self-pay

## 2021-06-01 ENCOUNTER — Encounter: Payer: Self-pay | Admitting: Nurse Practitioner

## 2021-06-01 ENCOUNTER — Ambulatory Visit: Payer: BC Managed Care – PPO | Admitting: Nurse Practitioner

## 2021-06-01 VITALS — BP 130/88 | HR 82 | Temp 97.2°F | Ht 59.0 in | Wt 130.4 lb

## 2021-06-01 DIAGNOSIS — T7840XA Allergy, unspecified, initial encounter: Secondary | ICD-10-CM | POA: Diagnosis not present

## 2021-06-01 DIAGNOSIS — Z91018 Allergy to other foods: Secondary | ICD-10-CM | POA: Diagnosis not present

## 2021-06-01 NOTE — Progress Notes (Signed)
   Subjective:  Patient ID: Mary Golden, female    DOB: January 05, 1966  Age: 55 y.o. MRN: [id number]  CC: Acute Visit (Pt c/o severe chapped lips x 2 weeks. Pt states she does moisturize and she is still having issues. )  Rash This is a new problem. The current episode started 1 to 4 weeks ago. The problem is unchanged. The affected locations include the lips. The rash is characterized by dryness. Associated with: lip balm. Pertinent negatives include no anorexia, congestion, cough, facial edema, fatigue, rhinorrhea, shortness of breath or sore throat. Past treatments include moisturizer. The treatment provided no relief. Her past medical history is significant for allergies.  has noticed increase sensitivity to nuts: scratchy throat. She is requesting for food allergen panel  Reviewed past Medical, Social and Family history today.  Outpatient Medications Prior to Visit  Medication Sig Dispense Refill  . EPINEPHrine 0.3 mg/0.3 mL IJ SOAJ injection Inject 0.3 mLs (0.3 mg total) into the muscle as needed for anaphylaxis. 1 each 1  . Multiple Vitamin (MULTIVITAMIN) tablet Take 1 tablet by mouth daily.     No facility-administered medications prior to visit.    ROS See HPI  Objective:  BP 130/88 (BP Location: Left Arm, Patient Position: Sitting, Cuff Size: Normal)   Pulse 82   Temp (!) 97.2 F (36.2 C) (Temporal)   Ht 4\' 11"  (1.499 m)   Wt 130 lb 6.4 oz (59.1 kg)   SpO2 98%   BMI 26.34 kg/m   Physical Exam Vitals reviewed.  HENT:     Head:     Jaw: There is normal jaw occlusion.     Salivary Glands: Right salivary gland is not diffusely enlarged or tender. Left salivary gland is not diffusely enlarged or tender.     Right Ear: External ear normal.     Left Ear: External ear normal.     Nose: Nose normal.     Mouth/Throat:     Lips: No lesions.     Mouth: Mucous membranes are moist.     Dentition: Normal dentition. No gingival swelling.     Tongue: No lesions. Tongue does  not deviate from midline.     Palate: No mass.     Pharynx: Uvula midline. No oropharyngeal exudate or posterior oropharyngeal erythema.     Tonsils: No tonsillar exudate.   Musculoskeletal:     Cervical back: Normal range of motion and neck supple.  Lymphadenopathy:     Cervical: No cervical adenopathy.  Neurological:     Mental Status: She is alert.    Assessment & Plan:  This visit occurred during the SARS-CoV-2 public health emergency.  Safety protocols were in place, including screening questions prior to the visit, additional usage of staff PPE, and extensive cleaning of exam room while observing appropriate contact time as indicated for disinfecting solutions.   Mary Golden was seen today for acute visit.  Diagnoses and all orders for this visit:  Allergic reaction, initial encounter -     Food Allergy Profile  Food allergy -     Food Allergy Profile    Problem List Items Addressed This Visit   None   Visit Diagnoses    Allergic reaction, initial encounter    -  Primary   Relevant Orders   Food Allergy Profile   Food allergy       Relevant Orders   Food Allergy Profile      Follow-up: No follow-ups on file.  Mary Mortimer, NP

## 2021-06-01 NOTE — Patient Instructions (Signed)
Use aloe vera gel to moisturize your lips Use sun screen  Go to lab for blood draw.

## 2021-06-02 LAB — FOOD ALLERGY PROFILE
Allergen, Salmon, f41: <0.1 kU/L
Almonds: <0.1 kU/L
CLASS: 0
CLASS: 0
CLASS: 0
CLASS: 0
CLASS: 0
CLASS: 0
CLASS: 0
CLASS: 0
CLASS: 0
CLASS: 0
CLASS: 0
Cashew IgE: <0.1 kU/L
Class: 0
Class: 0
Class: 0
Class: 2
Egg White IgE: <0.1 kU/L
Fish Cod: <0.1 kU/L
Hazelnut: <0.1 kU/L
Milk IgE: <0.1 kU/L
Peanut IgE: <0.1 kU/L
Scallop IgE: <0.1 kU/L
Sesame Seed f10: <0.1 kU/L
Shrimp IgE: 1.99 kU/L — ABNORMAL HIGH
Soybean IgE: <0.1 kU/L
Tuna IgE: <0.1 kU/L
Walnut: <0.1 kU/L
Wheat IgE: <0.1 kU/L

## 2021-06-02 LAB — INTERPRETATION:

## 2021-06-08 NOTE — Addendum Note (Signed)
Addended by: Michaela Corner on: 06/08/2021 09:34 AM   Modules accepted: Orders

## 2021-08-20 ENCOUNTER — Ambulatory Visit: Payer: BC Managed Care – PPO | Admitting: Allergy & Immunology

## 2021-08-20 ENCOUNTER — Encounter: Payer: Self-pay | Admitting: Allergy & Immunology

## 2021-08-20 ENCOUNTER — Other Ambulatory Visit: Payer: Self-pay

## 2021-08-20 VITALS — BP 126/80 | HR 85 | Temp 97.2°F | Resp 18 | Ht 59.0 in | Wt 131.2 lb

## 2021-08-20 DIAGNOSIS — J3089 Other allergic rhinitis: Secondary | ICD-10-CM | POA: Diagnosis not present

## 2021-08-20 DIAGNOSIS — L232 Allergic contact dermatitis due to cosmetics: Secondary | ICD-10-CM

## 2021-08-20 DIAGNOSIS — T7800XD Anaphylactic reaction due to unspecified food, subsequent encounter: Secondary | ICD-10-CM

## 2021-08-20 DIAGNOSIS — J302 Other seasonal allergic rhinitis: Secondary | ICD-10-CM | POA: Diagnosis not present

## 2021-08-20 NOTE — Progress Notes (Addendum)
NEW PATIENT  Date of Service/Encounter:  08/20/21  Consult requested by: Anne NgNche, Charlotte Lum, NP   Assessment:   Seasonal and perennial allergic rhinitis  Anaphylactic shock due to food  Allergic contact dermatitis due to cosmetics  Plan/Recommendations:   1. Seasonal and perennial allergic rhinitis - Testing today showed:  mixed feathers, grasses, trees, and indoor molds - Copy of test results provided.  - Avoidance measures provided. - Continue with: antihistamines as needed - We can certainly be more aggressive if your symptoms worsen, but you seem to be one that does not like medications.  2. Anaphylactic shock due to food (shellfish, tree nuts) - with negative skin testing - Testing was negative to peanuts, tree nuts, soy, and seafood - Given the severity of the reaction, I would like to get blood work to confirm this. - We we will call you in 1 to 2 weeks for the results of the testing. - EpiPen is up-to-date.   3. Allergic contact dermatitis due to cosmetics - Testing was negative to coconut and orange. - The other chemicals in the lip balm were not available for skin testing, but we could send blood work if we need to in the future. - However, if you are just tolerating other lip balm's and cosmetics without a problem, I do not think we need to do that. - If you continue to react to cosmetics, we can do patch testing to look for a contact dermatitis. - This might provide a certain trigger that he can avoid in cosmetics by reading the packages.  4. Follow up in three months or earlier if needed.     This note in its entirety was forwarded to the Provider who requested this consultation.  Subjective:   Mary PeersWanda Lane-Manring is a 55 y.o. female presenting today for evaluation of  Chief Complaint  Patient presents with   Allergic Reaction    To lip bomb - burts bees lips bomb and broke out with swelling and itching. Her lips turned dark.    Allergy Testing     Shellfish and all the nuts including coconut     Mary Golden has a history of the following: Patient Active Problem List   Diagnosis Date Noted   History of allergy to shellfish 08/26/2020   Family history of colon cancer 08/26/2020   Hot flash, menopausal 08/26/2020    History obtained from: chart review and patient.  Mary PeersWanda Lane-Zylstra was referred by Nche, Bonna Gainsharlotte Lum, NP.     Mary Golden is a 55 y.o. female presenting for an evaluation of possible food allergies .  She is half [place].    Allergic Rhinitis Symptom History: She does have some issues with environmental allergies. She has problems with leaves and grass. She can tolerate it. She tires not to take anything at all. Overlal she does not like medications.  Food Allergy Symptom History: She reports that she started having allergic reactions to shellfish. She thinks that this started around 1989. She was eating shellfish in [place] and she started having throat closure.  She has avoided all seafood since that time. She did premedicate with Benadryl before eating things like ceviche. Now with cooking it, she will have some hand scratching. She is avoiding all seafood at this time.   She did react to Burt's Bees. Overall this is just skin issues. She had skin manifestations. There was coconut in the Burt's Bees. Prior to this, she was using Burt's Bees without a problem. It lasted  around two weeks in total. She was still using the lip balm and never made the connection with the symptoms.     She does not eat coconut routinely, but she has had some throat scratching from the [employer]. She does not eat a lot of tree nuts. She will get some chicken salad occasionally with walnuts in it and she will have some throat tingling. She does not eat peanuts. She does not eat a lot of soy.   She tolerates pasta and bread without a problem. She does eat eat eggs without a problem. She eats cheese. She tolerates sesame without a  problem.   Otherwise, there is no history of other atopic diseases, including asthma, drug allergies, environmental allergies, stinging insect allergies, eczema, urticaria, or contact dermatitis. There is no significant infectious history. Vaccinations are up to date.    Past Medical History: Patient Active Problem List   Diagnosis Date Noted   History of allergy to shellfish 08/26/2020   Family history of colon cancer 08/26/2020   Hot flash, menopausal 08/26/2020    Medication List:  Allergies as of 08/20/2021       Reactions   Other Anaphylaxis   Nuts   Shellfish Allergy Anaphylaxis   All fish        Medication List        Accurate as of August 20, 2021  1:11 PM. If you have any questions, ask your nurse or doctor.          EPINEPHrine 0.3 mg/0.3 mL Soaj injection Commonly known as: EPI-PEN Inject 0.3 mLs (0.3 mg total) into the muscle as needed for anaphylaxis.   multivitamin tablet Take 1 tablet by mouth daily.        Birth History: non-contributory  Developmental History: non-contributory  Past Surgical History: Past Surgical History:  Procedure Laterality Date   ADENOIDECTOMY     CESAREAN SECTION     COLONOSCOPY  11/26/2013   normal   TONSILLECTOMY       Family History: Family History  Problem Relation Age of Onset   Cancer Mother 81       colon   Cancer Father        lung     Social History: Reverie lives at home with her husband. 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 One of her daughters is in [employer]. The other one is an Tax inspector and lives in [place]. They live in a house with hardwood and carpeting throughout the home. They have gas heating and central cooling. There is a dog inside of the home. There are no dust mite coverings on the bed and pillows. There is no smoking exposure.    Review of Systems  Constitutional: Negative.  Negative for chills, fever, malaise/fatigue and weight loss.  HENT:   Positive for congestion and sinus pain. Negative for ear discharge and ear pain.   Eyes:  Negative for pain, discharge and redness.  Respiratory:  Negative for cough, sputum production, shortness of breath and wheezing.   Cardiovascular: Negative.  Negative for chest pain and palpitations.  Gastrointestinal:  Negative for abdominal pain, constipation, diarrhea, heartburn, nausea and vomiting.  Skin: Negative.  Negative for itching and rash.  Neurological:  Negative for dizziness and headaches.  Endo/Heme/Allergies:  Positive for environmental allergies. Does not bruise/bleed easily.       Positive for food allergies.      Objective:   Blood pressure 126/80, pulse 85, temperature (!) 97.2  F (36.2 C), resp. rate 18, height 4\' 11"  (1.499 m), weight 131 lb 3.2 oz (59.5 kg), SpO2 95 %. Body mass index is 26.5 kg/m.   Physical Exam:   Physical Exam Vitals reviewed.  Constitutional:      Appearance: She is well-developed.  HENT:     Head: Normocephalic and atraumatic.     Right Ear: Tympanic membrane, ear canal and external ear normal. No drainage, swelling or tenderness. Tympanic membrane is not injected, scarred, erythematous, retracted or bulging.     Left Ear: Tympanic membrane, ear canal and external ear normal. No drainage, swelling or tenderness. Tympanic membrane is not injected, scarred, erythematous, retracted or bulging.     Nose: No nasal deformity, septal deviation, mucosal edema or rhinorrhea.     Right Turbinates: Enlarged and swollen.     Left Turbinates: Enlarged and swollen.     Right Sinus: No maxillary sinus tenderness or frontal sinus tenderness.     Left Sinus: No maxillary sinus tenderness or frontal sinus tenderness.     Mouth/Throat:     Mouth: Mucous membranes are not pale and not dry.     Pharynx: Uvula midline.  Eyes:     General: Lids are normal. Allergic shiner present.        Right eye: No discharge.        Left eye: No discharge.      Conjunctiva/sclera: Conjunctivae normal.     Right eye: Right conjunctiva is not injected. No chemosis.    Left eye: Left conjunctiva is not injected. No chemosis.    Pupils: Pupils are equal, round, and reactive to light.  Cardiovascular:     Rate and Rhythm: Normal rate and regular rhythm.     Heart sounds: Normal heart sounds.  Pulmonary:     Effort: Pulmonary effort is normal. No tachypnea, accessory muscle usage or respiratory distress.     Breath sounds: Normal breath sounds. No wheezing, rhonchi or rales.  Chest:     Chest wall: No tenderness.  Abdominal:     Tenderness: There is no abdominal tenderness. There is no guarding or rebound.  Lymphadenopathy:     Head:     Right side of head: No submandibular, tonsillar or occipital adenopathy.     Left side of head: No submandibular, tonsillar or occipital adenopathy.     Cervical: No cervical adenopathy.  Skin:    General: Skin is warm.     Capillary Refill: Capillary refill takes less than 2 seconds.     Coloration: Skin is not pale.     Findings: No abrasion, erythema, petechiae or rash. Rash is not papular, urticarial or vesicular.  Neurological:     Mental Status: She is alert.  Psychiatric:        Behavior: Behavior is cooperative.     Diagnostic studies:   Allergy Studies:     Airborne Adult Perc - 08/20/21 0927     Time Antigen Placed 08/22/21    Allergen Manufacturer [id number]    Location Back    Number of Test 59    Panel 1 Select    1. Control-Buffer 50% Glycerol Negative    2. Control-Histamine 1 mg/ml 2+    3. Albumin saline Negative    4. Bahia Negative    5. Waynette Buttery Negative    6. Johnson Negative    7. Kentucky Blue Negative    8. Meadow Fescue Negative    9. Perennial Rye Negative    10. Sweet  Vernal Negative    11. Timothy 2+    12. Cocklebur Negative    13. Burweed Marshelder Negative    14. Ragweed, short Negative    15. Ragweed, Giant Negative    16. Plantain,  English Negative    17. Lamb's  Quarters Negative    18. Sheep Sorrell Negative    19. Rough Pigweed Negative    20. Marsh Elder, Rough Negative    21. Mugwort, Common Negative    22. Ash mix Negative    23. Birch mix Negative    24. Beech American Negative    25. Box, Elder Negative    26. Cedar, red Negative    27. Cottonwood, Guinea-Bissau Negative    28. Elm mix Negative    29. Hickory Negative    30. Maple mix Negative    31. Oak, Guinea-[place]issau mix Negative    32. Pecan Pollen Negative    33. Pine mix 2+    34. Sycamore Eastern Negative    35. Walnut, Black Pollen Negative    36. Alternaria alternata Negative    37. Cladosporium Herbarum Negative    38. Aspergillus mix 3+    39. Penicillium mix Negative    40. Bipolaris sorokiniana (Helminthosporium) Negative    41. Drechslera spicifera (Curvularia) Negative    42. Mucor plumbeus Negative    43. Fusarium moniliforme Negative    44. Aureobasidium pullulans (pullulara) Negative    45. Rhizopus oryzae Negative    46. Botrytis cinera Negative    47. Epicoccum nigrum Negative    48. Phoma betae Negative    49. Candida Albicans 2+    50. Trichophyton mentagrophytes 3+    51. Mite, D Farinae  5,000 AU/ml Negative    52. Mite, D Pteronyssinus  5,000 AU/ml Negative    53. Cat Hair 10,000 BAU/ml Negative    54.  Dog Epithelia Negative    55. Mixed Feathers 2+    56. Horse Epithelia Negative    57. Cockroach, German Negative    58. Mouse Negative    59. Tobacco Leaf Negative             Food Adult Perc - 08/20/21 1000     Time Antigen Placed 8841    Allergen Manufacturer Waynette Buttery    Location Back    Control-Histamine 1 mg/ml Negative    1. Peanut Negative    2. Soybean Negative    10. Cashew Negative    11. Pecan Food Negative    12. Walnut Food Negative    13. Almond Negative    14. Hazelnut Negative    15. [place] nut Negative    16. Coconut Negative    17. Pistachio Negative    18. Catfish Negative    19. Bass Negative    20. Trout Negative    21.  Tuna Negative    22. Salmon Negative    23. Flounder Negative    24. Codfish Negative    25. Shrimp Negative    26. Crab Negative    27. Lobster Negative    28. Oyster Negative    29. Scallops Negative    56. Orange  Negative             Allergy testing results were read and interpreted by myself, documented by clinical staff.         Malachi Bonds, MD [facility]

## 2021-08-20 NOTE — Patient Instructions (Addendum)
1. Seasonal and perennial allergic rhinitis - Testing today showed:  mixed feathers, grasses, trees, and indoor molds - Copy of test results provided.  - Avoidance measures provided. - Continue with: antihistamines as needed - We can certainly be more aggressive if your symptoms worsen, but you seem to be one that does not like medications.  2. Anaphylactic shock due to food (shellfish, tree nuts) - with negative skin testing - Testing was negative to peanuts, tree nuts, soy, and seafood - Given the severity of the reaction, I would like to get blood work to confirm this. - We we will call you in 1 to 2 weeks for the results of the testing. - EpiPen is up-to-date.  3. Allergic contact dermatitis due to cosmetics - Testing was negative to coconut and orange. - The other chemicals in the lip balm were not available for skin testing, but we could send blood work if we need to in the future. - However, if you are just tolerating other lip balm's and cosmetics without a problem, I do not think we need to do that. - If you continue to react to cosmetics, we can do patch testing to look for a contact dermatitis. - This might provide a certain trigger that he can avoid in cosmetics by reading the packages.  4. Return in about 3 months (around 11/20/2021).    Please inform us of any Emergency Department visits, hospitalizations, or changes in symptoms. Call us before going to the ED for breathing or allergy symptoms since we might be able to fit you in for a sick visit. Feel free to contact us anytime with any questions, problems, or concerns.  It was a pleasure to meet you today!  Websites that have reliable patient information: 1. American Academy of Asthma, Allergy, and Immunology: www.aaaai.org 2. Food Allergy Research and Education (FARE): foodallergy.org 3. Mothers of Asthmatics: http://www.asthmacommunitynetwork.org 4. American College of Allergy, Asthma, and Immunology:  www.acaai.org   COVID-19 Vaccine Information can be found at: [email or web address] For questions related to vaccine distribution or appointments, please email [email or web address] or call [phone].   We realize that you might be concerned about having an allergic reaction to the COVID19 vaccines. To help with that concern, WE ARE OFFERING THE COVID19 VACCINES IN OUR OFFICE! Ask the front desk for dates!     "Like" [place] on Facebook and Instagram for our latest updates!      A healthy democracy works best when [employer] participate! Make sure you are registered to vote! If you have moved or changed any of your contact information, you will need to get this updated before voting!  In some cases, you MAY be able to register to vote online: [email or web address]      1. Control-Buffer 50% Glycerol Negative   2. Control-Histamine 1 mg/ml 2+   3. Albumin saline Negative   4. Bahia Negative   5. French Southern [place] Negative   6. Johnson Negative   7. Kentucky Blue Negative   8. Meadow Fescue Negative   9. Perennial Rye Negative   10. Sweet Vernal Negative   11. Timothy 2+   12. Cocklebur Negative   13. Burweed Marshelder Negative   14. Ragweed, short Negative   15. Ragweed, Giant Negative   16. Plantain,  English Negative   17. Lamb's Quarters Negative   18. Sheep Sorrell Negative   19. Rough Pigweed Negative   20. Marsh Elder, Rough Negative   21. Mugwort, Common Negative   22. Ash mix Negative  23Charletta Cousin mix Negative   24. Beech American Negative   25. Box, Elder Negative   26. Cedar, red Negative   27. Cottonwood, Guinea-Bissau Negative   28. Elm mix Negative   29. Hickory Negative   30. Maple mix Negative   31. Oak, Guinea-Bissau mix Negative   32. Pecan Pollen Negative   33. Pine mix 2+   34. Sycamore Eastern Negative   35. Walnut, Black Pollen Negative   36. Alternaria alternata Negative   37.  Cladosporium Herbarum Negative   38. Aspergillus mix 3+   39. Penicillium mix Negative   40. Bipolaris sorokiniana (Helminthosporium) Negative   41. Drechslera spicifera (Curvularia) Negative   42. Mucor plumbeus Negative   43. Fusarium moniliforme Negative   44. Aureobasidium pullulans (pullulara) Negative   45. Rhizopus oryzae Negative   46. Botrytis cinera Negative   47. Epicoccum nigrum Negative   48. Phoma betae Negative   49. Candida Albicans 2+   50. Trichophyton mentagrophytes 3+   51. Mite, D Farinae  5,000 AU/ml Negative   52. Mite, D Pteronyssinus  5,000 AU/ml Negative   53. Cat Hair 10,000 BAU/ml Negative   54.  Dog Epithelia Negative   55. Mixed Feathers 2+   56. Horse Epithelia Negative   57. Cockroach, German Negative   58. Mouse Negative   59. Tobacco Leaf Negative     Reducing Pollen Exposure  The American Academy of Allergy, Asthma and Immunology suggests the following steps to reduce your exposure to pollen during allergy seasons.    Do not hang sheets or clothing out to dry; pollen may collect on these items. Do not mow lawns or spend time around freshly cut grass; mowing stirs up pollen. Keep windows closed at night.  Keep car windows closed while driving. Minimize morning activities outdoors, a time when pollen counts are usually at their highest. Stay indoors as much as possible when pollen counts or humidity is high and on windy days when pollen tends to remain in the air longer. Use air conditioning when possible.  Many air conditioners have filters that trap the pollen spores. Use a HEPA room air filter to remove pollen form the indoor air you breathe.  Control of Mold Allergen   Mold and fungi can grow on a variety of surfaces provided certain temperature and moisture conditions exist.  Outdoor molds grow on plants, decaying vegetation and soil.  The major outdoor mold, Alternaria and Cladosporium, are found in very high numbers during hot and dry  conditions.  Generally, a late Summer - Fall peak is seen for common outdoor fungal spores.  Rain will temporarily lower outdoor mold spore count, but counts rise rapidly when the rainy period ends.  The most important indoor molds are Aspergillus and Penicillium.  Dark, humid and poorly ventilated basements are ideal sites for mold growth.  The next most common sites of mold growth are the bathroom and the kitchen.   Indoor (Perennial) Mold Control   Positive indoor molds via skin testing: Aspergillus, Candida, and Trichophyton  Maintain humidity below 50%. Clean washable surfaces with 5% bleach solution. Remove sources e.g. contaminated carpets.     True Test looks for the following sensitivities:

## 2021-08-26 LAB — ALLERGY PANEL 19, SEAFOOD GROUP
Allergen Salmon IgE: <0.1 kU/L
Catfish: <0.1 kU/L
Codfish IgE: <0.1 kU/L
F023-IgE Crab: 0.95 kU/L — AB
F080-IgE Lobster: 0.38 kU/L — AB
Shrimp IgE: 2.37 kU/L — AB
Tuna: <0.1 kU/L

## 2021-08-26 LAB — ALLERGEN SOYBEAN: Soybean IgE: <0.1 kU/L

## 2021-08-26 LAB — IGE NUT PROF. W/COMPONENT RFLX
F017-IgE Hazelnut (Filbert): <0.1 kU/L
F018-IgE Brazil Nut: <0.1 kU/L
F020-IgE Almond: <0.1 kU/L
F202-IgE Cashew Nut: <0.1 kU/L
F203-IgE Pistachio Nut: <0.1 kU/L
F256-IgE Walnut: <0.1 kU/L
Macadamia Nut, IgE: <0.1 kU/L
Peanut, IgE: <0.1 kU/L
Pecan Nut IgE: <0.1 kU/L

## 2021-11-11 ENCOUNTER — Other Ambulatory Visit: Payer: Self-pay | Admitting: Nurse Practitioner

## 2021-11-11 DIAGNOSIS — Z1231 Encounter for screening mammogram for malignant neoplasm of breast: Secondary | ICD-10-CM

## 2021-11-12 ENCOUNTER — Ambulatory Visit
Admission: RE | Admit: 2021-11-12 | Discharge: 2021-11-12 | Disposition: A | Discharge status: Home / Self Care | Payer: BC Managed Care – PPO | Source: Ambulatory Visit | Attending: Nurse Practitioner | Admitting: Nurse Practitioner

## 2021-11-12 ENCOUNTER — Other Ambulatory Visit: Payer: Self-pay

## 2021-11-12 DIAGNOSIS — Z1231 Encounter for screening mammogram for malignant neoplasm of breast: Secondary | ICD-10-CM

## 2021-11-13 ENCOUNTER — Other Ambulatory Visit: Payer: Self-pay | Admitting: Nurse Practitioner

## 2021-11-13 DIAGNOSIS — R928 Other abnormal and inconclusive findings on diagnostic imaging of breast: Secondary | ICD-10-CM

## 2021-11-16 ENCOUNTER — Ambulatory Visit (INDEPENDENT_AMBULATORY_CARE_PROVIDER_SITE_OTHER): Payer: BC Managed Care – PPO | Admitting: Nurse Practitioner

## 2021-11-16 ENCOUNTER — Encounter: Payer: Self-pay | Admitting: Nurse Practitioner

## 2021-11-16 ENCOUNTER — Other Ambulatory Visit: Payer: Self-pay

## 2021-11-16 ENCOUNTER — Other Ambulatory Visit (HOSPITAL_COMMUNITY)
Admission: RE | Admit: 2021-11-16 | Discharge: 2021-11-16 | Disposition: A | Discharge status: Home / Self Care | Payer: BC Managed Care – PPO | Source: Ambulatory Visit | Attending: Nurse Practitioner | Admitting: Nurse Practitioner

## 2021-11-16 VITALS — BP 138/86 | HR 86 | Temp 97.4°F | Ht 58.75 in | Wt 130.6 lb

## 2021-11-16 DIAGNOSIS — Z13220 Encounter for screening for lipoid disorders: Secondary | ICD-10-CM | POA: Diagnosis not present

## 2021-11-16 DIAGNOSIS — R030 Elevated blood-pressure reading, without diagnosis of hypertension: Secondary | ICD-10-CM | POA: Diagnosis not present

## 2021-11-16 DIAGNOSIS — Z124 Encounter for screening for malignant neoplasm of cervix: Secondary | ICD-10-CM | POA: Diagnosis present

## 2021-11-16 DIAGNOSIS — K625 Hemorrhage of anus and rectum: Secondary | ICD-10-CM | POA: Diagnosis not present

## 2021-11-16 DIAGNOSIS — Z0001 Encounter for general adult medical examination with abnormal findings: Secondary | ICD-10-CM | POA: Diagnosis present

## 2021-11-16 DIAGNOSIS — K644 Residual hemorrhoidal skin tags: Secondary | ICD-10-CM | POA: Diagnosis not present

## 2021-11-16 DIAGNOSIS — Z23 Encounter for immunization: Secondary | ICD-10-CM

## 2021-11-16 DIAGNOSIS — Z114 Encounter for screening for human immunodeficiency virus [HIV]: Secondary | ICD-10-CM

## 2021-11-16 DIAGNOSIS — Z136 Encounter for screening for cardiovascular disorders: Secondary | ICD-10-CM

## 2021-11-16 DIAGNOSIS — A5901 Trichomonal vulvovaginitis: Secondary | ICD-10-CM

## 2021-11-16 HISTORY — DX: Elevated blood-pressure reading, without diagnosis of hypertension: R03.0

## 2021-11-16 MED ORDER — HYDROCORTISONE ACETATE 25 MG RE SUPP: 25.0000 mg | Freq: Two times a day (BID) | RECTAL | 0 refills | Status: DC

## 2021-11-16 NOTE — Assessment & Plan Note (Signed)
Reports high sodium diet in last 88month due to kitchen renovation. BP Readings from Last 3 Encounters:  11/16/21 138/86  08/20/21 126/80  06/01/21 130/88   Advised about need for DASh diet and daily exercise. She is to monitor BP at home and call office if >140/90.

## 2021-11-16 NOTE — Patient Instructions (Addendum)
Monitor BP 3x/week in AM Call office if BP persistently >140/90  Sign medical release to get colonoscopy report from Dr. Earlean Shawl (2019).  Return stool kit to lab once a week x 3weeks. If positive, will need appt with Dr. Earlean Shawl  Perform hip and knee exercise daily x 4-6weeks for hip and knee pain.  Go to lab for blood draw and stool kit  Preventive Care 50-53 Years Old, Female Preventive care refers to lifestyle choices and visits with your health care provider that can promote health and wellness. Preventive care visits are also called wellness exams. What can I expect for my preventive care visit? Counseling Your health care provider may ask you questions about your: Medical history, including: Past medical problems. Family medical history. Pregnancy history. Current health, including: Menstrual cycle. Method of birth control. Emotional well-being. Home life and relationship well-being. Sexual activity and sexual health. Lifestyle, including: Alcohol, nicotine or tobacco, and drug use. Access to firearms. Diet, exercise, and sleep habits. Work and work Statistician. Sunscreen use. Safety issues such as seatbelt and bike helmet use. Physical exam Your health care provider will check your: Height and weight. These may be used to calculate your BMI (body mass index). BMI is a measurement that tells if you are at a healthy weight. Waist circumference. This measures the distance around your waistline. This measurement also tells if you are at a healthy weight and may help predict your risk of certain diseases, such as type 2 diabetes and high blood pressure. Heart rate and blood pressure. Body temperature. Skin for abnormal spots. What immunizations do I need? Vaccines are usually given at various ages, according to a schedule. Your health care provider will recommend vaccines for you based on your age, medical history, and lifestyle or other factors, such as travel or where you  work. What tests do I need? Screening Your health care provider may recommend screening tests for certain conditions. This may include: Lipid and cholesterol levels. Diabetes screening. This is done by checking your blood sugar (glucose) after you have not eaten for a while (fasting). Pelvic exam and Pap test. Hepatitis B test. Hepatitis C test. HIV (human immunodeficiency virus) test. STI (sexually transmitted infection) testing, if you are at risk. Lung cancer screening. Colorectal cancer screening. Mammogram. Talk with your health care provider about when you should start having regular mammograms. This may depend on whether you have a family history of breast cancer. BRCA-related cancer screening. This may be done if you have a family history of breast, ovarian, tubal, or peritoneal cancers. Bone density scan. This is done to screen for osteoporosis. Talk with your health care provider about your test results, treatment options, and if necessary, the need for more tests. Follow these instructions at home: Eating and drinking  Eat a diet that includes fresh fruits and vegetables, whole grains, lean protein, and low-fat dairy products. Take vitamin and mineral supplements as recommended by your health care provider. Do not drink alcohol if: Your health care provider tells you not to drink. You are pregnant, may be pregnant, or are planning to become pregnant. If you drink alcohol: Limit how much you have to 0-1 drink a day. Know how much alcohol is in your drink. In the [place] one drink equals one 12 oz bottle of beer (355 mL), one 5 oz glass of wine (148 mL), or one 1 oz glass of hard liquor (44 mL). Lifestyle Brush your teeth every morning and night with fluoride toothpaste. Floss one time each  day. Exercise for at least 30 minutes 5 or more days each week. Do not use any products that contain nicotine or tobacco. These products include cigarettes, chewing tobacco, and vaping  devices, such as e-cigarettes. If you need help quitting, ask your health care provider. Do not use drugs. If you are sexually active, practice safe sex. Use a condom or other form of protection to prevent STIs. If you do not wish to become pregnant, use a form of birth control. If you plan to become pregnant, see your health care provider for a prepregnancy visit. Take aspirin only as told by your health care provider. Make sure that you understand how much to take and what form to take. Work with your health care provider to find out whether it is safe and beneficial for you to take aspirin daily. Find healthy ways to manage stress, such as: Meditation, yoga, or listening to music. Journaling. Talking to a trusted person. Spending time with friends and family. Minimize exposure to UV radiation to reduce your risk of skin cancer. Safety Always wear your seat belt while driving or riding in a vehicle. Do not drive: If you have been drinking alcohol. Do not ride with someone who has been drinking. When you are tired or distracted. While texting. If you have been using any mind-altering substances or drugs. Wear a helmet and other protective equipment during sports activities. If you have firearms in your house, make sure you follow all gun safety procedures. Seek help if you have been physically or sexually abused. What's next? Visit your health care provider once a year for an annual wellness visit. Ask your health care provider how often you should have your eyes and teeth checked. Stay up to date on all vaccines. This information is not intended to replace advice given to you by your health care provider. Make sure you discuss any questions you have with your health care provider. Document Revised: 06/10/2021 Document Reviewed: 06/10/2021 Elsevier Patient Education  [place].

## 2021-11-16 NOTE — Assessment & Plan Note (Addendum)
Anusol suppository sent Encourage high fiber diet and adequate oral hydration

## 2021-11-16 NOTE — Progress Notes (Signed)
Subjective:    Patient ID: Mary Golden, female    DOB: 01-Jul-1966, 55 y.o.   MRN: [id number]  Patient presents today for CPE and eval of acute conditions  HPI Elevated BP without diagnosis of hypertension Reports high sodium diet in last 65monthdue to kitchen renovation. BP Readings from Last 3 Encounters:  11/16/21 138/86  08/20/21 126/80  06/01/21 130/88   Advised about need for DASh diet and daily exercise. Mary Golden is to monitor BP at home and call office if >140/90.  Blood per rectum Intermittent, blood on tissue x 1719month5episodes. Reports change in diet which has led to constipation, no rectal pain, no ABD pain, no bloody stool or melena. Wt Readings from Last 3 Encounters:  11/16/21 130 lb 9.6 oz (59.2 kg)  08/20/21 131 lb 3.2 oz (59.5 kg)  06/01/21 130 lb 6.4 oz (59.1 kg)  Last colonoscopy 2019 per patient by Dr. MeEarlean Shawlrepeat every 5y92yrue to Fhx of colon cancer. Report requested. IFOB kit provided today Check CBC  Inflamed external hemorrhoid Anusol suppository sent Encourage high fiber diet and adequate oral hydration  Vision:will schedule Dental:up to date Diet:regular Exercise:walking, yard work Weight:  Wt Readings from Last 3 Encounters:  11/16/21 130 lb 9.6 oz (59.2 kg)  08/20/21 131 lb 3.2 oz (59.5 kg)  06/01/21 130 lb 6.4 oz (59.1 kg)     Sexual History (orientation,birth control, marital status, STD):not sexually active, perimenopausal-LMP 81mo50montho, agreed to pelvic and breast exam today. No need for STD screen. Last mammogram 10/2021, repeat 11/2021  Depression/Suicide: Depression screen PHQ [facility] 11/16/2021 08/26/2020  Decreased Interest 0 1  Down, Depressed, Hopeless 0 1  PHQ - 2 Score 0 2  Altered sleeping 2 2  Tired, decreased energy 1 1  Change in appetite 0 0  Feeling bad or failure about yourself  0 0  Trouble concentrating 0 2  Moving slowly or fidgety/restless 0 0  Suicidal thoughts 0 0  PHQ-9 Score 3 7  Difficult doing  work/chores Not difficult at all Not difficult at all   No flowsheet data found.  Immunizations: (TDAP, Hep C screen, Pneumovax, Influenza, zoster)  Health Maintenance  Topic Date Due   HIV Screening  Never done   Hepatitis C Screening: USPSTF Recommendation to screen - Ages 18-752-79  Never done   COVID-19 Vaccine (4 - Booster for Pfizer series) 11/25/2020   Zoster (Shingles) Vaccine (2 of 2) 01/11/2022   Pap Smear  07/30/2022   Mammogram  11/13/2023   Colon Cancer Screening  12/11/2023   Tetanus Vaccine  11/17/2031   Flu Shot  Completed   Pneumococcal Vaccination  Aged Out   HPV Vaccine  Aged Out [employer] (11-27yr15yrses 1mont1819month, 49yrs 48yrer, College 1dose) MenB vaccine (16-24yrs 219yr 19month a29monthFall Risk: Fall Risk  11/16/2021 06/01/2021  Falls in the past year? 0 0  Number falls in past yr: 0 0  Injury with Fall? 0 0  Risk for fall due to : No Fall Risks No Fall Risks  Follow up Falls evaluation completed Falls evaluation completed   Medications and allergies reviewed with patient and updated if appropriate.  Patient Active Problem List   Diagnosis Date Noted   Blood per rectum 11/16/2021   Inflamed external hemorrhoid 11/16/2021   Elevated BP without diagnosis of hypertension 11/16/2021   History of allergy to shellfish 08/26/2020   Family history of colon cancer 08/26/2020   Hot flash, menopausal 08/26/2020  Current Outpatient Medications on File Prior to Visit  Medication Sig Dispense Refill   EPINEPHrine 0.3 mg/0.3 mL IJ SOAJ injection Inject 0.3 mLs (0.3 mg total) into the muscle as needed for anaphylaxis. 1 each 1   Multiple Vitamin (MULTIVITAMIN) tablet Take 1 tablet by mouth daily.     No current facility-administered medications on file prior to visit.    History reviewed. No pertinent past medical history.  Past Surgical History:  Procedure Laterality Date   ADENOIDECTOMY     CESAREAN SECTION     COLONOSCOPY  11/26/2013    normal   TONSILLECTOMY      Social History   Socioeconomic History   Marital status: Divorced    Spouse name: Not on file   Number of children: 2   Years of education: Not on file   Highest education level: Not on file  Occupational History   Not on file  Tobacco Use   Smoking status: Never   Smokeless tobacco: Never  Vaping Use   Vaping Use: Never used  Substance and Sexual Activity   Alcohol use: No   Drug use: No   Sexual activity: Yes    Birth control/protection: Post-menopausal  Other Topics Concern   Not on file  Social History Narrative   Not on file   Social Determinants of Health   Financial Resource Strain: Not on file  Food Insecurity: Not on file  Transportation Needs: Not on file  Physical Activity: Not on file  Stress: Not on file  Social Connections: Not on file    Family History  Problem Relation Age of Onset   Cancer Mother 21       colon   Cancer Father        lung        Review of Systems  Constitutional:  Negative for fever, malaise/fatigue and weight loss.  HENT:  Negative for congestion and sore throat.   Eyes:        Negative for visual changes  Respiratory:  Negative for cough and shortness of breath.   Cardiovascular:  Negative for chest pain, palpitations and leg swelling.  Gastrointestinal:  Positive for constipation. Negative for abdominal pain, blood in stool, diarrhea, heartburn, melena, nausea and vomiting.  Genitourinary:  Negative for dysuria, frequency and urgency.  Musculoskeletal:  Positive for joint pain. Negative for back pain, falls and myalgias.  Skin:  Negative for rash.  Neurological:  Negative for dizziness, sensory change and headaches.  Endo/Heme/Allergies:  Does not bruise/bleed easily.  Psychiatric/Behavioral:  Negative for depression, substance abuse and suicidal ideas. The patient is not nervous/anxious.    Objective:   Vitals:   11/16/21 1327  BP: 138/86  Pulse: 86  Temp: (!) 97.4 F (36.3 C)   SpO2: 99%    Body mass index is 26.6 kg/m.   Physical Examination:  Physical Exam Vitals reviewed. Exam conducted with a chaperone present.  Constitutional:      General: Mary Golden is not in acute distress.    Appearance: Mary Golden is well-developed.  HENT:     Right Ear: Tympanic membrane, ear canal and external ear normal.     Left Ear: Tympanic membrane, ear canal and external ear normal.  Eyes:     Extraocular Movements: Extraocular movements intact.     Conjunctiva/sclera: Conjunctivae normal.  Cardiovascular:     Rate and Rhythm: Normal rate and regular rhythm.     Pulses: Normal pulses.     Heart sounds: Normal heart sounds.  Pulmonary:     Effort: Pulmonary effort is normal. No respiratory distress.     Breath sounds: Normal breath sounds.  Chest:     Chest wall: No tenderness.  Breasts:    Breasts are symmetrical.     Right: Normal.     Left: Normal.  Abdominal:     General: Bowel sounds are normal.     Palpations: Abdomen is soft.     Hernia: There is no hernia in the left inguinal area or right inguinal area.  Genitourinary:    General: Normal vulva.     Exam position: Lithotomy position.     Labia:        Right: No rash, tenderness or lesion.        Left: No rash, tenderness or lesion.      Urethra: Prolapse present.     Vagina: Normal. No vaginal discharge or erythema.     Cervix: Normal.     Uterus: Normal.      Adnexa: Right adnexa normal and left adnexa normal.     Rectum: Anal fissure and external hemorrhoid present.  Musculoskeletal:        General: Normal range of motion.     Cervical back: Normal range of motion and neck supple.     Right lower leg: No edema.     Left lower leg: No edema.  Lymphadenopathy:     Cervical: No cervical adenopathy.     Upper Body:     Right upper body: No supraclavicular, axillary or pectoral adenopathy.     Left upper body: No supraclavicular, axillary or pectoral adenopathy.     Lower Body: No right inguinal  adenopathy. No left inguinal adenopathy.  Skin:    General: Skin is warm and dry.  Neurological:     Mental Status: Mary Golden is alert and oriented to person, place, and time.     Deep Tendon Reflexes: Reflexes are normal and symmetric.  Psychiatric:        Mood and Affect: Mood normal.        Behavior: Behavior normal.        Thought Content: Thought content normal.    ASSESSMENT and PLAN: This visit occurred during the SARS-CoV-2 public health emergency.  Safety protocols were in place, including screening questions prior to the visit, additional usage of staff PPE, and extensive cleaning of exam room while observing appropriate contact time as indicated for disinfecting solutions.   Mary Golden was seen today for annual exam.  Diagnoses and all orders for this visit:  Encounter for preventative adult health care exam with abnormal findings -     Comprehensive metabolic panel -     CBC with Differential/Platelet -     Lipid panel -     TSH -     Cytology - PAP  Encounter for Papanicolaou smear for cervical cancer screening -     Cytology - PAP  Blood per rectum -     Fecal occult blood, imunochemical; Standing  Inflamed external hemorrhoid -     hydrocortisone (ANUSOL-HC) 25 MG suppository; Place 1 suppository (25 mg total) rectally 2 (two) times daily.  Encounter for lipid screening for cardiovascular disease -     Lipid panel  Need for diphtheria-tetanus-pertussis (Tdap) vaccine -     Cancel: Td vaccine greater than or equal to 7yo preservative free IM -     Tdap vaccine greater than or equal to 7yo IM  Need for shingles vaccine -  Varicella-zoster vaccine IM  Elevated BP without diagnosis of hypertension     Problem List Items Addressed This Visit       Cardiovascular and Mediastinum   Inflamed external hemorrhoid    Anusol suppository sent Encourage high fiber diet and adequate oral hydration      Relevant Medications   hydrocortisone (ANUSOL-HC) 25 MG  suppository     Digestive   Blood per rectum    Intermittent, blood on tissue x 24month 5episodes. Reports change in diet which has led to constipation, no rectal pain, no ABD pain, no bloody stool or melena. Wt Readings from Last 3 Encounters:  11/16/21 130 lb 9.6 oz (59.2 kg)  08/20/21 131 lb 3.2 oz (59.5 kg)  06/01/21 130 lb 6.4 oz (59.1 kg)  Last colonoscopy 2019 per patient by Dr. MEarlean Shawl repeat every 539yrdue to Fhx of colon cancer. Report requested. IFOB kit provided today Check CBC      Relevant Orders   Fecal occult blood, imunochemical     Other   Elevated BP without diagnosis of hypertension    Reports high sodium diet in last 29m48monthe to kitchen renovation. BP Readings from Last 3 Encounters:  11/16/21 138/86  08/20/21 126/80  06/01/21 130/88   Advised about need for DASh diet and daily exercise. Mary Golden is to monitor BP at home and call office if >140/90.      Other Visit Diagnoses     Encounter for preventative adult health care exam with abnormal findings    -  Primary   Relevant Orders   Comprehensive metabolic panel   CBC with Differential/Platelet   Lipid panel   TSH   Cytology - PAP   Encounter for Papanicolaou smear for cervical cancer screening       Relevant Orders   Cytology - PAP   Encounter for lipid screening for cardiovascular disease       Relevant Orders   Lipid panel   Need for diphtheria-tetanus-pertussis (Tdap) vaccine       Relevant Orders   Tdap vaccine greater than or equal to 7yo IM (Completed)   Need for shingles vaccine       Relevant Orders   Varicella-zoster vaccine IM (Completed)       Follow up: Return in about 1 year (around 11/16/2022) for CPE (fatsing).  ChaWilfred LacyP

## 2021-11-16 NOTE — Assessment & Plan Note (Signed)
Intermittent, blood on tissue x 65month 5episodes. Reports change in diet which has led to constipation, no rectal pain, no ABD pain, no bloody stool or melena. Wt Readings from Last 3 Encounters:  11/16/21 130 lb 9.6 oz (59.2 kg)  08/20/21 131 lb 3.2 oz (59.5 kg)  06/01/21 130 lb 6.4 oz (59.1 kg)  Last colonoscopy 2019 per patient by Dr. MEarlean Shawl repeat every [id number] to [facility] of colon cancer. Report requested. IFOB kit provided today Check CBC

## 2021-11-17 LAB — COMPREHENSIVE METABOLIC PANEL
ALT: 11 U/L (ref 0–35)
AST: 17 U/L (ref 0–37)
Albumin: 4.7 g/dL (ref 3.5–5.2)
Alkaline Phosphatase: 81 U/L (ref 39–117)
BUN: 12 mg/dL (ref 6–23)
CO2: 29 mEq/L (ref 19–32)
Calcium: 9.7 mg/dL (ref 8.4–10.5)
Chloride: 103 mEq/L (ref 96–112)
Creatinine, Ser: 0.76 mg/dL (ref 0.40–1.20)
GFR: 89.32 mL/min (ref >60.00)
Glucose, Bld: 79 mg/dL (ref 70–99)
Potassium: 3.7 mEq/L (ref 3.5–5.1)
Sodium: 140 mEq/L (ref 135–145)
Total Bilirubin: 0.5 mg/dL (ref 0.2–1.2)
Total Protein: 7.9 g/dL (ref 6.0–8.3)

## 2021-11-17 LAB — LIPID PANEL
Cholesterol: 225 mg/dL — ABNORMAL HIGH (ref 0–200)
HDL: 65.3 mg/dL (ref >39.00)
LDL Cholesterol: 144 mg/dL — ABNORMAL HIGH (ref 0–99)
NonHDL: 159.35
Total CHOL/HDL Ratio: 3
Triglycerides: 79 mg/dL (ref 0.0–149.0)
VLDL: 15.8 mg/dL (ref 0.0–40.0)

## 2021-11-17 LAB — CBC WITH DIFFERENTIAL/PLATELET
Basophils Absolute: 0 10*3/uL (ref 0.0–0.1)
Basophils Relative: 0.7 % (ref 0.0–3.0)
Eosinophils Absolute: 0.1 10*3/uL (ref 0.0–0.7)
Eosinophils Relative: 1.3 % (ref 0.0–5.0)
HCT: 41.8 % (ref 36.0–46.0)
Hemoglobin: 13.4 g/dL (ref 12.0–15.0)
Lymphocytes Relative: 38 % (ref 12.0–46.0)
Lymphs Abs: 1.6 10*3/uL (ref 0.7–4.0)
MCHC: 32 g/dL (ref 30.0–36.0)
MCV: 82.7 fl (ref 78.0–100.0)
Monocytes Absolute: 0.3 10*3/uL (ref 0.1–1.0)
Monocytes Relative: 6.7 % (ref 3.0–12.0)
Neutro Abs: 2.3 10*3/uL (ref 1.4–7.7)
Neutrophils Relative %: 53.3 % (ref 43.0–77.0)
Platelets: 219 10*3/uL (ref 150.0–400.0)
RBC: 5.06 Mil/uL (ref 3.87–5.11)
RDW: 13.4 % (ref 11.5–15.5)
WBC: 4.3 10*3/uL (ref 4.0–10.5)

## 2021-11-17 LAB — TSH: TSH: 1.39 u[IU]/mL (ref 0.35–5.50)

## 2021-11-23 ENCOUNTER — Other Ambulatory Visit: Payer: Self-pay | Admitting: Nurse Practitioner

## 2021-11-23 LAB — CYTOLOGY - PAP
Comment: NEGATIVE
Diagnosis: NEGATIVE
High risk HPV: NEGATIVE

## 2021-11-23 MED ORDER — METRONIDAZOLE 500 MG PO TABS: 500.0000 mg | ORAL_TABLET | Freq: Two times a day (BID) | ORAL | 0 refills | Status: AC

## 2021-11-23 NOTE — Addendum Note (Signed)
Addended by: Alysia Penna L on: 11/23/2021 10:54 AM   Modules accepted: Orders

## 2021-11-24 ENCOUNTER — Other Ambulatory Visit (INDEPENDENT_AMBULATORY_CARE_PROVIDER_SITE_OTHER): Payer: BC Managed Care – PPO

## 2021-11-24 DIAGNOSIS — K625 Hemorrhage of anus and rectum: Secondary | ICD-10-CM | POA: Diagnosis not present

## 2021-11-24 LAB — FECAL OCCULT BLOOD, IMMUNOCHEMICAL: Fecal Occult Bld: NEGATIVE

## 2021-11-30 ENCOUNTER — Other Ambulatory Visit (INDEPENDENT_AMBULATORY_CARE_PROVIDER_SITE_OTHER): Payer: BC Managed Care – PPO

## 2021-11-30 DIAGNOSIS — K625 Hemorrhage of anus and rectum: Secondary | ICD-10-CM | POA: Diagnosis not present

## 2021-12-02 LAB — FECAL OCCULT BLOOD, IMMUNOCHEMICAL: Fecal Occult Bld: NEGATIVE

## 2021-12-03 ENCOUNTER — Ambulatory Visit: Payer: BC Managed Care – PPO | Admitting: Allergy & Immunology

## 2021-12-08 ENCOUNTER — Other Ambulatory Visit: Payer: Self-pay | Admitting: Nurse Practitioner

## 2021-12-08 DIAGNOSIS — K644 Residual hemorrhoidal skin tags: Secondary | ICD-10-CM

## 2021-12-08 MED ORDER — HYDROCORTISONE (PERIANAL) 2.5 % EX CREA: 1.0000 "application " | TOPICAL_CREAM | Freq: Two times a day (BID) | CUTANEOUS | 0 refills | Status: DC

## 2021-12-11 ENCOUNTER — Other Ambulatory Visit: Payer: Self-pay | Admitting: Nurse Practitioner

## 2021-12-11 ENCOUNTER — Ambulatory Visit
Admission: RE | Admit: 2021-12-11 | Discharge: 2021-12-11 | Disposition: A | Discharge status: Home / Self Care | Payer: BC Managed Care – PPO | Source: Ambulatory Visit | Attending: Nurse Practitioner | Admitting: Nurse Practitioner

## 2021-12-11 ENCOUNTER — Other Ambulatory Visit: Payer: Self-pay

## 2021-12-11 DIAGNOSIS — R928 Other abnormal and inconclusive findings on diagnostic imaging of breast: Secondary | ICD-10-CM

## 2021-12-11 DIAGNOSIS — N6489 Other specified disorders of breast: Secondary | ICD-10-CM

## 2021-12-17 ENCOUNTER — Other Ambulatory Visit: Payer: Self-pay

## 2021-12-17 ENCOUNTER — Other Ambulatory Visit: Payer: BC Managed Care – PPO

## 2021-12-17 LAB — FECAL OCCULT BLOOD, IMMUNOCHEMICAL: Fecal Occult Bld: NEGATIVE

## 2021-12-17 NOTE — Progress Notes (Signed)
Pt dropped off stool specimen

## 2021-12-24 ENCOUNTER — Ambulatory Visit
Admission: RE | Admit: 2021-12-24 | Discharge: 2021-12-24 | Disposition: A | Discharge status: Home / Self Care | Payer: BC Managed Care – PPO | Source: Ambulatory Visit | Attending: Nurse Practitioner | Admitting: Nurse Practitioner

## 2021-12-24 DIAGNOSIS — N6489 Other specified disorders of breast: Secondary | ICD-10-CM

## 2021-12-24 HISTORY — PX: BREAST BIOPSY: SHX20

## 2022-01-15 ENCOUNTER — Ambulatory Visit: Payer: Self-pay | Admitting: Surgery

## 2022-01-15 DIAGNOSIS — N6489 Other specified disorders of breast: Secondary | ICD-10-CM

## 2022-01-15 LAB — HM MAMMOGRAPHY

## 2022-01-19 ENCOUNTER — Other Ambulatory Visit: Payer: Self-pay | Admitting: Surgery

## 2022-01-19 DIAGNOSIS — N6489 Other specified disorders of breast: Secondary | ICD-10-CM

## 2022-02-16 ENCOUNTER — Ambulatory Visit (INDEPENDENT_AMBULATORY_CARE_PROVIDER_SITE_OTHER): Payer: BC Managed Care – PPO

## 2022-02-16 ENCOUNTER — Other Ambulatory Visit: Payer: Self-pay

## 2022-02-16 DIAGNOSIS — Z23 Encounter for immunization: Secondary | ICD-10-CM | POA: Diagnosis not present

## 2022-02-16 NOTE — Progress Notes (Signed)
Per the orders of Alysia Penna NP, pt is here for second dose of [place] vaccine. Pt received vaccine in left deltoid at 8:45 am given by [place]  CMA/CPT. Pt tolerated injection well.

## 2022-02-17 ENCOUNTER — Encounter (HOSPITAL_BASED_OUTPATIENT_CLINIC_OR_DEPARTMENT_OTHER): Payer: Self-pay | Admitting: Surgery

## 2022-02-17 ENCOUNTER — Other Ambulatory Visit: Payer: Self-pay

## 2022-02-22 NOTE — Progress Notes (Signed)

## 2022-02-24 ENCOUNTER — Ambulatory Visit
Admission: RE | Admit: 2022-02-24 | Discharge: 2022-02-24 | Disposition: A | Discharge status: Home / Self Care | Payer: BC Managed Care – PPO | Source: Ambulatory Visit | Attending: Surgery | Admitting: Surgery

## 2022-02-24 DIAGNOSIS — N6489 Other specified disorders of breast: Secondary | ICD-10-CM

## 2022-02-24 NOTE — Anesthesia Preprocedure Evaluation (Addendum)
Anesthesia Evaluation  ?Patient identified by MRN, date of birth, ID band ?Patient awake ? ? ? ?Reviewed: ?Allergy & Precautions, NPO status , Patient's Chart, lab work & pertinent test results ? ?Airway ?Mallampati: III ? ?TM Distance: >3 FB ?Neck ROM: Full ? ? ? Dental ?no notable dental hx. ?(+) Teeth Intact, Dental Advisory Given ?  ?Pulmonary ?neg pulmonary ROS,  ?  ?Pulmonary exam normal ?breath sounds clear to auscultation ? ? ? ? ? ? Cardiovascular ?negative cardio ROS ?Normal cardiovascular exam ?Rhythm:Regular Rate:Normal ? ? ?  ?Neuro/Psych ?negative neurological ROS ? negative psych ROS  ? GI/Hepatic ?negative GI ROS, Neg liver ROS,   ?Endo/Other  ?negative endocrine ROS ? Renal/GU ?negative Renal ROS  ?negative genitourinary ?  ?Musculoskeletal ?negative musculoskeletal ROS ?(+)  ? Abdominal ?  ?Peds ? Hematology ?negative hematology ROS ?(+)   ?Anesthesia Other Findings ?L radial scar ? Reproductive/Obstetrics ?negative OB ROS ? ?  ? ? ? ? ? ? ? ? ? ? ? ? ? ?  ?  ? ? ? ? ? ? ? ?Anesthesia Physical ?Anesthesia Plan ? ?ASA: 1 ? ?Anesthesia Plan: General  ? ?Post-op Pain Management: Tylenol PO (pre-op)* and Toradol IV (intra-op)*  ? ?Induction: Intravenous ? ?PONV Risk Score and Plan: 3 and Ondansetron, Dexamethasone, Midazolam and Treatment may vary due to age or medical condition ? ?Airway Management Planned: LMA ? ?Additional Equipment: None ? ?Intra-op Plan:  ? ?Post-operative Plan: Extubation in OR ? ?Informed Consent: I have reviewed the patients History and Physical, chart, labs and discussed the procedure including the risks, benefits and alternatives for the proposed anesthesia with the patient or authorized representative who has indicated his/her understanding and acceptance.  ? ? ? ?Dental advisory given ? ?Plan Discussed with: CRNA ? ?Anesthesia Plan Comments:   ? ? ? ? ? ?Anesthesia Quick Evaluation ? ?

## 2022-02-25 ENCOUNTER — Other Ambulatory Visit: Payer: Self-pay

## 2022-02-25 ENCOUNTER — Encounter (HOSPITAL_BASED_OUTPATIENT_CLINIC_OR_DEPARTMENT_OTHER): Payer: Self-pay | Admitting: Surgery

## 2022-02-25 ENCOUNTER — Ambulatory Visit (HOSPITAL_BASED_OUTPATIENT_CLINIC_OR_DEPARTMENT_OTHER): Payer: BC Managed Care – PPO | Admitting: Anesthesiology

## 2022-02-25 ENCOUNTER — Ambulatory Visit (HOSPITAL_BASED_OUTPATIENT_CLINIC_OR_DEPARTMENT_OTHER)
Admission: RE | Admit: 2022-02-25 | Discharge: 2022-02-25 | Disposition: A | Discharge status: Home / Self Care | Payer: BC Managed Care – PPO | Source: Ambulatory Visit | Attending: Surgery | Admitting: Surgery

## 2022-02-25 ENCOUNTER — Encounter (HOSPITAL_BASED_OUTPATIENT_CLINIC_OR_DEPARTMENT_OTHER)
Admission: RE | Disposition: A | Discharge status: Home / Self Care | Payer: Self-pay | Source: Ambulatory Visit | Attending: Surgery

## 2022-02-25 ENCOUNTER — Ambulatory Visit
Admission: RE | Admit: 2022-02-25 | Discharge: 2022-02-25 | Disposition: A | Discharge status: Home / Self Care | Payer: BC Managed Care – PPO | Source: Ambulatory Visit | Attending: Surgery | Admitting: Surgery

## 2022-02-25 DIAGNOSIS — N6022 Fibroadenosis of left breast: Secondary | ICD-10-CM | POA: Insufficient documentation

## 2022-02-25 DIAGNOSIS — N6489 Other specified disorders of breast: Secondary | ICD-10-CM

## 2022-02-25 HISTORY — PX: BREAST LUMPECTOMY WITH RADIOACTIVE SEED LOCALIZATION: SHX6424

## 2022-02-25 HISTORY — PX: BREAST EXCISIONAL BIOPSY: SUR124

## 2022-02-25 SURGERY — BREAST LUMPECTOMY WITH RADIOACTIVE SEED LOCALIZATION
Anesthesia: General | Site: Breast | Laterality: Left

## 2022-02-25 MED ORDER — FENTANYL CITRATE (PF) 100 MCG/2ML IJ SOLN
INTRAMUSCULAR | Status: DC | PRN
  Administered 2022-02-25: 25 ug via INTRAVENOUS
  Administered 2022-02-25: 50 ug via INTRAVENOUS
  Administered 2022-02-25: 25 ug via INTRAVENOUS

## 2022-02-25 MED ORDER — ACETAMINOPHEN 500 MG PO TABS
ORAL_TABLET | ORAL | Status: AC
  Filled 2022-02-25: qty 2

## 2022-02-25 MED ORDER — LACTATED RINGERS IV SOLN: INTRAVENOUS | Status: DC | PRN

## 2022-02-25 MED ORDER — DEXAMETHASONE SODIUM PHOSPHATE 10 MG/ML IJ SOLN
INTRAMUSCULAR | Status: DC | PRN
  Administered 2022-02-25: 10 mg via INTRAVENOUS

## 2022-02-25 MED ORDER — HYDROMORPHONE HCL 1 MG/ML IJ SOLN: 0.2500 mg | INTRAMUSCULAR | Status: DC | PRN

## 2022-02-25 MED ORDER — ONDANSETRON HCL 4 MG/2ML IJ SOLN
INTRAMUSCULAR | Status: DC | PRN
  Administered 2022-02-25: 4 mg via INTRAVENOUS

## 2022-02-25 MED ORDER — LACTATED RINGERS IV SOLN: INTRAVENOUS | Status: DC

## 2022-02-25 MED ORDER — MEPERIDINE HCL 25 MG/ML IJ SOLN: 6.2500 mg | INTRAMUSCULAR | Status: DC | PRN

## 2022-02-25 MED ORDER — ACETAMINOPHEN 500 MG PO TABS: 1000.0000 mg | ORAL_TABLET | ORAL | Status: AC

## 2022-02-25 MED ORDER — SODIUM CHLORIDE 0.9 % IV SOLN
INTRAVENOUS | Status: DC | PRN
  Administered 2022-02-25: 250 mL

## 2022-02-25 MED ORDER — ACETAMINOPHEN 500 MG PO TABS
1000.0000 mg | ORAL_TABLET | Freq: Once | ORAL | Status: AC
  Administered 2022-02-25: 1000 mg via ORAL

## 2022-02-25 MED ORDER — CEFAZOLIN SODIUM-DEXTROSE 2-4 GM/100ML-% IV SOLN: 2.0000 g | INTRAVENOUS | Status: DC

## 2022-02-25 MED ORDER — LIDOCAINE 2% (20 MG/ML) 5 ML SYRINGE
INTRAMUSCULAR | Status: AC
  Filled 2022-02-25: qty 5

## 2022-02-25 MED ORDER — CHLORHEXIDINE GLUCONATE CLOTH 2 % EX PADS: 6.0000 | MEDICATED_PAD | Freq: Once | CUTANEOUS | Status: DC

## 2022-02-25 MED ORDER — PROPOFOL 10 MG/ML IV BOLUS
INTRAVENOUS | Status: DC | PRN
  Administered 2022-02-25: 150 mg via INTRAVENOUS

## 2022-02-25 MED ORDER — MIDAZOLAM HCL 2 MG/2ML IJ SOLN
INTRAMUSCULAR | Status: DC | PRN
  Administered 2022-02-25: 2 mg via INTRAVENOUS

## 2022-02-25 MED ORDER — OXYCODONE HCL 5 MG/5ML PO SOLN: 5.0000 mg | Freq: Once | ORAL | Status: DC | PRN

## 2022-02-25 MED ORDER — KETOROLAC TROMETHAMINE 30 MG/ML IJ SOLN: 30.0000 mg | Freq: Once | INTRAMUSCULAR | Status: DC | PRN

## 2022-02-25 MED ORDER — ONDANSETRON HCL 4 MG/2ML IJ SOLN: 4.0000 mg | Freq: Once | INTRAMUSCULAR | Status: DC | PRN

## 2022-02-25 MED ORDER — OXYCODONE HCL 5 MG PO TABS: 5.0000 mg | ORAL_TABLET | Freq: Four times a day (QID) | ORAL | 0 refills | Status: DC | PRN

## 2022-02-25 MED ORDER — LIDOCAINE HCL (CARDIAC) PF 100 MG/5ML IV SOSY
PREFILLED_SYRINGE | INTRAVENOUS | Status: DC | PRN
  Administered 2022-02-25: 60 mg via INTRAVENOUS

## 2022-02-25 MED ORDER — BUPIVACAINE-EPINEPHRINE (PF) 0.25% -1:200000 IJ SOLN
INTRAMUSCULAR | Status: DC | PRN
Start: 2022-02-25 — End: 2022-02-25
  Administered 2022-02-25: 20 mL

## 2022-02-25 MED ORDER — FENTANYL CITRATE (PF) 100 MCG/2ML IJ SOLN
INTRAMUSCULAR | Status: AC
Start: 2022-02-25 — End: ?
  Filled 2022-02-25: qty 2

## 2022-02-25 MED ORDER — MIDAZOLAM HCL 2 MG/2ML IJ SOLN
INTRAMUSCULAR | Status: AC
Start: 2022-02-25 — End: ?
  Filled 2022-02-25: qty 2

## 2022-02-25 MED ORDER — BUPIVACAINE-EPINEPHRINE (PF) 0.25% -1:200000 IJ SOLN
INTRAMUSCULAR | Status: AC
  Filled 2022-02-25: qty 150

## 2022-02-25 MED ORDER — AMISULPRIDE (ANTIEMETIC) 5 MG/2ML IV SOLN: 10.0000 mg | Freq: Once | INTRAVENOUS | Status: DC | PRN

## 2022-02-25 MED ORDER — OXYCODONE HCL 5 MG PO TABS: 5.0000 mg | ORAL_TABLET | Freq: Once | ORAL | Status: DC | PRN

## 2022-02-25 MED ORDER — CEFAZOLIN SODIUM-DEXTROSE 2-4 GM/100ML-% IV SOLN
INTRAVENOUS | Status: AC
  Filled 2022-02-25: qty 100

## 2022-02-25 MED ORDER — IBUPROFEN 800 MG PO TABS: 800.0000 mg | ORAL_TABLET | Freq: Three times a day (TID) | ORAL | 0 refills | Status: AC | PRN

## 2022-02-25 MED ORDER — SODIUM CHLORIDE 0.9 % IV SOLN
INTRAVENOUS | Status: AC
  Filled 2022-02-25: qty 10

## 2022-02-25 MED ORDER — CEFAZOLIN SODIUM-DEXTROSE 2-3 GM-%(50ML) IV SOLR
INTRAVENOUS | Status: DC | PRN
  Administered 2022-02-25: 2 g via INTRAVENOUS

## 2022-02-25 MED ORDER — PROPOFOL 10 MG/ML IV BOLUS
INTRAVENOUS | Status: AC
  Filled 2022-02-25: qty 20

## 2022-02-25 SURGICAL SUPPLY — 44 items
ADH SKN CLS APL DERMABOND .7 (GAUZE/BANDAGES/DRESSINGS) ×1
APL PRP STRL LF DISP 70% ISPRP (MISCELLANEOUS) ×1
BINDER BREAST LRG (GAUZE/BANDAGES/DRESSINGS) ×1 IMPLANT
BINDER BREAST MEDIUM (GAUZE/BANDAGES/DRESSINGS) IMPLANT
BLADE SURG 15 STRL LF DISP TIS (BLADE) ×2 IMPLANT
BLADE SURG 15 STRL SS (BLADE) ×2
CANISTER SUCT 1200ML W/VALVE (MISCELLANEOUS) ×1 IMPLANT
CHLORAPREP W/TINT 26 (MISCELLANEOUS) ×3 IMPLANT
COVER BACK TABLE 60X90IN (DRAPES) ×3 IMPLANT
COVER MAYO STAND STRL (DRAPES) ×3 IMPLANT
COVER PROBE W GEL 5X96 (DRAPES) ×3 IMPLANT
DERMABOND ADVANCED (GAUZE/BANDAGES/DRESSINGS) ×1
DERMABOND ADVANCED .7 DNX12 (GAUZE/BANDAGES/DRESSINGS) ×2 IMPLANT
DRAPE LAPAROTOMY 100X72 PEDS (DRAPES) ×3 IMPLANT
DRAPE UTILITY XL STRL (DRAPES) ×3 IMPLANT
ELECT COATED BLADE 2.86 ST (ELECTRODE) ×3 IMPLANT
ELECT REM PT RETURN 9FT ADLT (ELECTROSURGICAL) ×2
ELECTRODE REM PT RTRN 9FT ADLT (ELECTROSURGICAL) ×2 IMPLANT
GLOVE SRG 8 PF TXTR STRL LF DI (GLOVE) ×2 IMPLANT
GLOVE SURG LTX SZ8 (GLOVE) ×3 IMPLANT
GLOVE SURG POLYISO LF SZ6.5 (GLOVE) ×1 IMPLANT
GLOVE SURG POLYISO LF SZ7 (GLOVE) ×1 IMPLANT
GLOVE SURG UNDER POLY LF SZ6.5 (GLOVE) ×1 IMPLANT
GLOVE SURG UNDER POLY LF SZ7 (GLOVE) ×1 IMPLANT
GLOVE SURG UNDER POLY LF SZ8 (GLOVE) ×2
GOWN STRL REUS W/ TWL LRG LVL3 (GOWN DISPOSABLE) ×4 IMPLANT
GOWN STRL REUS W/ TWL XL LVL3 (GOWN DISPOSABLE) ×2 IMPLANT
GOWN STRL REUS W/TWL LRG LVL3 (GOWN DISPOSABLE) ×4
GOWN STRL REUS W/TWL XL LVL3 (GOWN DISPOSABLE) ×2
KIT MARKER MARGIN INK (KITS) ×3 IMPLANT
NDL HYPO 25X1 1.5 SAFETY (NEEDLE) ×2 IMPLANT
NEEDLE HYPO 25X1 1.5 SAFETY (NEEDLE) ×2 IMPLANT
NS IRRIG 1000ML POUR BTL (IV SOLUTION) ×3 IMPLANT
PACK BASIN DAY SURGERY FS (CUSTOM PROCEDURE TRAY) ×3 IMPLANT
PENCIL SMOKE EVACUATOR (MISCELLANEOUS) ×3 IMPLANT
SLEEVE SCD COMPRESS KNEE MED (STOCKING) ×3 IMPLANT
SPONGE T-LAP 4X18 ~~LOC~~+RFID (SPONGE) ×3 IMPLANT
SUT MNCRL AB 4-0 PS2 18 (SUTURE) ×3 IMPLANT
SUT VICRYL 3-0 CR8 SH (SUTURE) ×3 IMPLANT
SYR CONTROL 10ML LL (SYRINGE) ×3 IMPLANT
TOWEL GREEN STERILE FF (TOWEL DISPOSABLE) ×3 IMPLANT
TRAY FAXITRON CT DISP (TRAY / TRAY PROCEDURE) ×3 IMPLANT
TUBE CONNECTING 20X1/4 (TUBING) ×1 IMPLANT
YANKAUER SUCT BULB TIP NO VENT (SUCTIONS) ×1 IMPLANT

## 2022-02-25 NOTE — Anesthesia Postprocedure Evaluation (Signed)
Anesthesia Post Note ? ?Patient: MAHIKA VANVOORHIS ? ?Procedure(s) Performed: LEFT BREAST LUMPECTOMY WITH RADIOACTIVE SEED LOCALIZATION (Left: Breast) ? ?  ? ?Patient location during evaluation: PACU ?Anesthesia Type: General ?Level of consciousness: awake and alert, oriented and patient cooperative ?Pain management: pain level controlled ?Vital Signs Assessment: post-procedure vital signs reviewed and stable ?Respiratory status: spontaneous breathing, nonlabored ventilation and respiratory function stable ?Cardiovascular status: blood pressure returned to baseline and stable ?Postop Assessment: no apparent nausea or vomiting ?Anesthetic complications: no ? ? ?No notable events documented. ? ?Last Vitals:  ?Vitals:  ? 02/25/22 0945 02/25/22 1020  ?BP: 116/80 138/87  ?Pulse: 77 85  ?Resp: 13 16  ?Temp:  36.4 ?C  ?SpO2: 100% 95%  ?  ?Last Pain:  ?Vitals:  ? 02/25/22 1020  ?TempSrc:   ?PainSc: 0-No pain  ? ? ?  ?  ?  ?  ?  ?  ? ?Tennis Must Jasmane Brockway ? ? ? ? ?

## 2022-02-25 NOTE — Transfer of Care (Signed)
Immediate Anesthesia Transfer of Care Note ? ?Patient: Mary Golden ? ?Procedure(s) Performed: LEFT BREAST LUMPECTOMY WITH RADIOACTIVE SEED LOCALIZATION (Left: Breast) ? ?Patient Location: PACU ? ?Anesthesia Type:General ? ?Level of Consciousness: awake, alert  and oriented ? ?Airway & Oxygen Therapy: Patient Spontanous Breathing and Patient connected to face mask oxygen ? ?Post-op Assessment: Report given to RN and Post -op Vital signs reviewed and stable ? ?Post vital signs: Reviewed and stable ? ?Last Vitals:  ?Vitals Value Taken Time  ?BP    ?Temp    ?Pulse 98 02/25/22 0939  ?Resp 22 02/25/22 0939  ?SpO2 100 % 02/25/22 0939  ?Vitals shown include unvalidated device data. ? ?Last Pain:  ?Vitals:  ? 02/25/22 0733  ?TempSrc: Oral  ?PainSc: 0-No pain  ?   ? ?Patients Stated Pain Goal: 5 (02/25/22 [id number]) ? ?Complications: No notable events documented. ?

## 2022-02-25 NOTE — Anesthesia Procedure Notes (Signed)
Procedure Name: LMA Insertion ?Date/Time: 02/25/2022 9:41 AM ?Performed by: Karen Kitchens, CRNA ?Pre-anesthesia Checklist: Patient identified, Emergency Drugs available, Suction available and Patient being monitored ?Patient Re-evaluated:Patient Re-evaluated prior to induction ?Oxygen Delivery Method: Circle system utilized ?Preoxygenation: Pre-oxygenation with 100% oxygen ?Induction Type: IV induction ?Ventilation: Mask ventilation without difficulty ?LMA: LMA inserted ?LMA Size: 4.0 ?Number of attempts: 1 ?Airway Equipment and Method: Bite block ?Placement Confirmation: positive ETCO2 ?Tube secured with: Tape ?Dental Injury: Teeth and Oropharynx as per pre-operative assessment  ? ? ? ? ?

## 2022-02-25 NOTE — H&P (Signed)
History of Present Illness: ?Mary Golden is a 56 y.o. female who is seen today as an office consultation at the request of Dr. Elease Etienne for evaluation of Breast Mass ?.  ? ?Patient presents for evaluation of abnormal left breast mammogram and mass. She went screening mammogram which showed a density left breast upper outer quadrant core biopsy proven to be a radial scar. No family history of breast cancer and this was her first breast biopsy. No other family history of any other significant cancer that she can recollect. She is otherwise healthy. She has some issues with her blood pressure that she is working through with her primary care doctor. Denies any pain, mass or discharge from either breast. ? ?Review of Systems: ?A complete review of systems was obtained from the patient. I have reviewed this information and discussed as appropriate with the patient. See HPI as well for other ROS. ? ? ? ?Medical History: ?History reviewed. No pertinent past medical history. ? ?There is no problem list on file for this patient. ? ?Past Surgical History:  ?Procedure Laterality Date  ? CESAREAN SECTION  ? ? ?No Known Allergies ? ?No current outpatient medications on file prior to visit.  ? ?No current facility-administered medications on file prior to visit.  ? ?Family History  ?Problem Relation Age of Onset  ? Colon cancer Mother  ? ? ?Social History  ? ?Tobacco Use  ?Smoking Status Never  ?Smokeless Tobacco Never  ? ? ?Social History  ? ?Socioeconomic History  ? Marital status: Divorced  ?Tobacco Use  ? Smoking status: Never  ? Smokeless tobacco: Never  ? ?Objective:  ? ?Vitals:  ?01/15/22 0904  ?BP: (!) 144/100  ?Pulse: 100  ?Weight: 61.5 kg (135 lb 9.6 oz)  ?Height: 149.9 cm (4\' 11" )  ? ?Body mass index is 27.39 kg/m?. ? ?Physical Exam ?Constitutional:  ?Appearance: Normal appearance.  ?HENT:  ?Head: Normocephalic.  ?Eyes:  ?General: No scleral icterus. ?Pupils: Pupils are equal, round, and reactive to light.   ?Cardiovascular:  ?Rate and Rhythm: Normal rate.  ?Pulmonary:  ?Effort: Pulmonary effort is normal.  ?Breath sounds: No stridor.  ?Chest:  ?Breasts: ?Right: Normal.  ?Left: Normal.  ?Musculoskeletal:  ?General: Normal range of motion.  ?Cervical back: Normal range of motion and neck supple.  ?Lymphadenopathy:  ?Upper Body:  ?Right upper body: No supraclavicular or axillary adenopathy.  ?Left upper body: No supraclavicular or axillary adenopathy.  ?Skin: ?General: Skin is warm and dry.  ?Neurological:  ?General: No focal deficit present.  ?Mental Status: She is alert and oriented to person, place, and time.  ?Psychiatric:  ?Mood and Affect: Mood normal.  ?Behavior: Behavior normal.  ? ? ? ?Labs, Imaging and Diagnostic Testing: ?Diagnosis ?Breast, left, needle core biopsy, 2 o'clock ?- COMPLEX SCLEROSING LESION. NO ATYPIA OR MALIGNANCY. SEE COMMENT. ?Microscopic Comment ?Results were conveyed to at the [facility] at 11:30 a.m. on 12/25/21. (MJ:kh ?12/25/21) ?CLINICAL DATA: 56 year old female presenting as a recall from ?screening for possible left breast distortion. ?  ?EXAM: ?DIGITAL DIAGNOSTIC UNILATERAL LEFT MAMMOGRAM WITH TOMOSYNTHESIS AND ?CAD; ULTRASOUND LEFT BREAST LIMITED ?  ?TECHNIQUE: ?Left digital diagnostic mammography and breast tomosynthesis was ?performed. The images were evaluated with computer-aided detection.; ?Targeted ultrasound examination of the left breast was performed. ?  ?COMPARISON: Previous exam(s). ?  ?ACR Breast Density Category c: The breast tissue is heterogeneously ?dense, which may obscure small masses. ?  ?FINDINGS: ?Mammogram: ?  ?Spot compression tomosynthesis and full field mL tomosynthesis  views ?of the left breast were performed demonstrating persistence of ?distortion in the upper outer left breast. ?  ?On physical exam of the upper-outer left breast I do not feel a ?discrete mass or focal area of thickening. ?  ?Ultrasound: ?  ?Targeted  ultrasound performed in the left breast at 2 o'clock 3 cm ?from nipple demonstrating an irregular hypoechoic mass measuring 1.0 ?x 0.5 x 0.5 cm. This corresponds to the mammographic finding. ?  ?Targeted ultrasound of the left axilla demonstrates normal lymph ?nodes. ?  ?IMPRESSION: ?Suspicious mass in the left breast at 2 o'clock measuring 1.0 cm. ?  ?RECOMMENDATION: ?Ultrasound-guided core needle biopsy of the left breast mass at 2 ?o'clock. ?  ?I have discussed the findings and recommendations with the patient ?who agrees to proceed with biopsy. The patient will be scheduled for ?the biopsy appointment prior to leaving the office today. ?  ?BI-RADS CATEGORY 4: Suspicious. ?  ?  ?Electronically Signed ?By: Emmaline Kluver M.D. ?On: 12/11/2021 12:24 ? ?Assessment and Plan:  ? ?Diagnoses and all orders for this visit: ? ?Radial scar of breast ? ? ? ?Reviewed the natural history of radial scars. Discussed the possibility of upgrade to a more worrisome lesion of being anywhere between 5 and 20% literature. Discussed rationale for excision. Discussed observation as well as an alternative. After discussion the pros and cons of all the above options, she is opted for left breast seed localized lumpectomy ? ?Marland KitchenThe procedure has been discussed with the patient. Alternatives to surgery have been discussed with the patient. Risks of surgery include bleeding, Infection, Seroma formation, death, and the need for further surgery. The patient understands and wishes to proceed. ?

## 2022-02-25 NOTE — Discharge Instructions (Addendum)
Next Tylenol due at 1:45 today if needed. ? ?[facility] ?Office Phone Number [phone] ? ?BREAST BIOPSY/ PARTIAL MASTECTOMY: POST OP INSTRUCTIONS ? ?Always review your discharge instruction sheet given to you by the facility where your surgery was performed. ? ?IF YOU HAVE DISABILITY OR FAMILY LEAVE FORMS, YOU MUST BRING THEM TO THE OFFICE FOR PROCESSING.  DO NOT GIVE THEM TO YOUR DOCTOR. ? ?A prescription for pain medication may be given to you upon discharge.  Take your pain medication as prescribed, if needed.  If narcotic pain medicine is not needed, then you may take acetaminophen (Tylenol) or ibuprofen (Advil) as needed. ?Take your usually prescribed medications unless otherwise directed ?If you need a refill on your pain medication, please contact your pharmacy.  They will contact our office to request authorization.  Prescriptions will not be filled after 5pm or on week-ends. ?You should eat very light the first 24 hours after surgery, such as soup, crackers, pudding, etc.  Resume your normal diet the day after surgery. ?Most patients will experience some swelling and bruising in the breast.  Ice packs and a good support bra will help.  Swelling and bruising can take several days to resolve.  ?It is common to experience some constipation if taking pain medication after surgery.  Increasing fluid intake and taking a stool softener will usually help or prevent this problem from occurring.  A mild laxative (Milk of Magnesia or Miralax) should be taken according to package directions if there are no bowel movements after 48 hours. ?Unless discharge instructions indicate otherwise, you may remove your bandages 24-48 hours after surgery, and you may shower at that time.  You may have steri-strips (small skin tapes) in place directly over the incision.  These strips should be left on the skin for 7-10 days.  If your surgeon used skin glue on the incision, you may shower in 24 hours.  The glue  will flake off over the next 2-3 weeks.  Any sutures or staples will be removed at the office during your follow-up visit. ?ACTIVITIES:  You may resume regular daily activities (gradually increasing) beginning the next day.  Wearing a good support bra or sports bra minimizes pain and swelling.  You may have sexual intercourse when it is comfortable. ?You may drive when you no longer are taking prescription pain medication, you can comfortably wear a seatbelt, and you can safely maneuver your car and apply brakes. ?RETURN TO WORK:  ______________________________________________________________________________________ ?You should see your doctor in the office for a follow-up appointment approximately two weeks after your surgery.  Your doctor?s nurse will typically make your follow-up appointment when she calls you with your pathology report.  Expect your pathology report 2-3 business days after your surgery.  You may call to check if you do not hear from [place] after three days. ?OTHER INSTRUCTIONS: _______________________________________________________________________________________________ _____________________________________________________________________________________________________________________________________ ?_____________________________________________________________________________________________________________________________________ ?_____________________________________________________________________________________________________________________________________ ? ?WHEN TO CALL YOUR DOCTOR: ?Fever over 101.0 ?Nausea and/or vomiting. ?Extreme swelling or bruising. ?Continued bleeding from incision. ?Increased pain, redness, or drainage from the incision. ? ?The clinic staff is available to answer your questions during regular business hours.  Please don?t hesitate to call and ask to speak to one of the nurses for clinical concerns.  If you have a medical emergency, go to the nearest emergency  room or call 911.  A surgeon from [facility] is always on call at the hospital. ? ?For further questions, please visit [email or web address]   ? ? ?Post Anesthesia Home Care Instructions ? ?  Activity: ?Get plenty of rest for the remainder of the day. A responsible individual must stay with you for 24 hours following the procedure.  ?For the next 24 hours, DO NOT: ?-Drive a car ?-Paediatric nurse ?-Drink alcoholic beverages ?-Take any medication unless instructed by your physician ?-Make any legal decisions or sign important papers. ? ?Meals: ?Start with liquid foods such as gelatin or soup. Progress to regular foods as tolerated. Avoid greasy, spicy, heavy foods. If nausea and/or vomiting occur, drink only clear liquids until the nausea and/or vomiting subsides. Call your physician if vomiting continues. ? ?Special Instructions/Symptoms: ?Your throat may feel dry or sore from the anesthesia or the breathing tube placed in your throat during surgery. If this causes discomfort, gargle with warm salt water. The discomfort should disappear within 24 hours. ? ?If you had a scopolamine patch placed behind your ear for the management of post- operative nausea and/or vomiting: ? ?1. The medication in the patch is effective for 72 hours, after which it should be removed.  Wrap patch in a tissue and discard in the trash. Wash hands thoroughly with soap and water. ?2. You may remove the patch earlier than 72 hours if you experience unpleasant side effects which may include dry mouth, dizziness or visual disturbances. ?3. Avoid touching the patch. Wash your hands with soap and water after contact with the patch. ?    ?

## 2022-02-25 NOTE — Op Note (Signed)
Preoperative diagnosis: Left breast upper outer quadrant radial scar ? ?Postoperative diagnosis: Same ? ?Procedure: Left breast seed localized lumpectomy ? ?Surgeon: Erroll Luna, MD ? ?Anesthesia: LMA with 0.25% Marcaine with epinephrine ? ?EBL: Minimal ? ?Specimen: Left breast tissue with seed and clip verified by Faxitron ? ?Drains: None ? ?Indications for procedure: The patient is a 56 year old female with a screen detected left breast mammographic abnormality.  An area noted in her left breast was biopsied and found to be a radial scar.  We discussed the natural history of radial scars and the significance with core biopsies.  We discussed potential upgrade risk of anywhere from 5- 20%.  We discussed observation as an alternative.  She opted for left breast seed localized lumpectomy.The procedure has been discussed with the patient. Alternatives to surgery have been discussed with the patient.  Risks of surgery include bleeding,  Infection,  Seroma formation, death,  and the need for further surgery.   The patient understands and wishes to proceed.  ? ?Description of procedure: The patient was met in the holding area and questions were answered.  The procedure was reviewed and the left side was marked as the correct site.  Films were available for review.  The patient was taken back to the operative room.  She was placed upon the OR table.  After induction of general esthesia, left breast was prepped and draped in a sterile fashion and timeout performed.  Proper patient, site and procedure verified.  Films were available for review.  Local anesthetic was infiltrated left breast upper outer quadrant after using the neoprobe to identify the seed.  Curvilinear incision was made over the signal.  Dissection was carried down around the seed and clip and this was excised with a grossly negative margin.  Hemostasis was achieved with cautery.  The Faxitron image revealed the seed and clip to be present.  The cavity  is irrigated.  Local anesthetic was demonstrated throughout the cavity.  Irrigation was used.  The cavities then closed with a deep layer 3-0 Vicryl.  4 Monocryl was used to approximate the skin.  Dermabond applied.  All counts found to be correct.  The patient was awoke extubated taken to recovery in satisfactory condition. ?

## 2022-02-26 NOTE — Progress Notes (Signed)
Left message stating courtesy call and if any questions or concerns please call the doctors office.  

## 2022-03-01 ENCOUNTER — Encounter (HOSPITAL_BASED_OUTPATIENT_CLINIC_OR_DEPARTMENT_OTHER): Payer: Self-pay | Admitting: Surgery

## 2022-03-01 LAB — SURGICAL PATHOLOGY

## 2022-03-02 ENCOUNTER — Encounter: Payer: Self-pay | Admitting: Surgery

## 2022-03-11 ENCOUNTER — Encounter (HOSPITAL_COMMUNITY): Payer: Self-pay

## 2022-07-12 ENCOUNTER — Emergency Department (HOSPITAL_COMMUNITY)
Admission: EM | Admit: 2022-07-12 | Discharge: 2022-07-12 | Disposition: A | Discharge status: Home / Self Care | Payer: BC Managed Care – PPO | Attending: Emergency Medicine | Admitting: Emergency Medicine

## 2022-07-12 ENCOUNTER — Other Ambulatory Visit: Payer: Self-pay

## 2022-07-12 ENCOUNTER — Ambulatory Visit
Admission: EM | Admit: 2022-07-12 | Discharge: 2022-07-12 | Disposition: A | Discharge status: Other Acute Inpatient Hospital | Payer: BC Managed Care – PPO | Attending: Emergency Medicine | Admitting: Emergency Medicine

## 2022-07-12 ENCOUNTER — Encounter (HOSPITAL_COMMUNITY): Payer: Self-pay

## 2022-07-12 ENCOUNTER — Encounter: Payer: Self-pay | Admitting: Emergency Medicine

## 2022-07-12 DIAGNOSIS — T7840XA Allergy, unspecified, initial encounter: Secondary | ICD-10-CM | POA: Insufficient documentation

## 2022-07-12 DIAGNOSIS — R000 Tachycardia, unspecified: Secondary | ICD-10-CM | POA: Diagnosis not present

## 2022-07-12 DIAGNOSIS — T783XXA Angioneurotic edema, initial encounter: Secondary | ICD-10-CM

## 2022-07-12 DIAGNOSIS — Z91018 Allergy to other foods: Secondary | ICD-10-CM

## 2022-07-12 DIAGNOSIS — Z91013 Allergy to seafood: Secondary | ICD-10-CM

## 2022-07-12 LAB — BASIC METABOLIC PANEL
Anion gap: 8 (ref 5–15)
BUN: 10 mg/dL (ref 6–20)
CO2: 25 mmol/L (ref 22–32)
Calcium: 9.4 mg/dL (ref 8.9–10.3)
Chloride: 105 mmol/L (ref 98–111)
Creatinine, Ser: 0.73 mg/dL (ref 0.44–1.00)
GFR, Estimated: >60 mL/min (ref >60)
Glucose, Bld: 115 mg/dL — ABNORMAL HIGH (ref 70–99)
Potassium: 3.2 mmol/L — ABNORMAL LOW (ref 3.5–5.1)
Sodium: 138 mmol/L (ref 135–145)

## 2022-07-12 LAB — CBC
HCT: 43 % (ref 36.0–46.0)
Hemoglobin: 13.5 g/dL (ref 12.0–15.0)
MCH: 26.2 pg (ref 26.0–34.0)
MCHC: 31.4 g/dL (ref 30.0–36.0)
MCV: 83.3 fL (ref 80.0–100.0)
Platelets: 249 10*3/uL (ref 150–400)
RBC: 5.16 MIL/uL — ABNORMAL HIGH (ref 3.87–5.11)
RDW: 12.9 % (ref 11.5–15.5)
WBC: 6.3 10*3/uL (ref 4.0–10.5)
nRBC: 0 % (ref 0.0–0.2)

## 2022-07-12 MED ORDER — EPINEPHRINE 0.3 MG/0.3ML IJ SOAJ: 0.3000 mg | INTRAMUSCULAR | 1 refills | Status: AC | PRN

## 2022-07-12 MED ORDER — FAMOTIDINE 20 MG PO TABS: 20.0000 mg | ORAL_TABLET | Freq: Two times a day (BID) | ORAL | 0 refills | Status: DC

## 2022-07-12 MED ORDER — DIPHENHYDRAMINE HCL 50 MG/ML IJ SOLN
25.0000 mg | Freq: Once | INTRAMUSCULAR | Status: AC
  Administered 2022-07-12: 25 mg via INTRAVENOUS

## 2022-07-12 MED ORDER — EPINEPHRINE 0.3 MG/0.3ML IJ SOAJ
0.3000 mg | Freq: Once | INTRAMUSCULAR | Status: AC
  Administered 2022-07-12: 0.3 mg via INTRAMUSCULAR

## 2022-07-12 MED ORDER — DIPHENHYDRAMINE HCL 25 MG PO TABS: 25.0000 mg | ORAL_TABLET | Freq: Four times a day (QID) | ORAL | 0 refills | Status: DC | PRN

## 2022-07-12 MED ORDER — PREDNISONE 10 MG PO TABS: 40.0000 mg | ORAL_TABLET | Freq: Every day | ORAL | 0 refills | Status: AC

## 2022-07-12 NOTE — ED Notes (Signed)
I provided reinforced discharge education based off of discharge summary/care provided. Pt acknowledged and understood my education. Pt had no further questions/concerns for provider/myself.   

## 2022-07-12 NOTE — ED Provider Notes (Signed)
[facility]    CSN: [id number] Arrival date & time: 07/12/22  1407    HISTORY   Chief Complaint  Patient presents with   Allergic Reaction   HPI Mary Golden is a pleasant, 56 y.o. female who presents to urgent care. Patient presents to urgent care reporting an acute allergic reaction to an ingredient and a new coffee creamer that she drank at 9 AM.  The time is now 2:30 PM.  Patient states she took 25 mg Benadryl at 10 AM, 11 AM and again at 12 AM.  Patient states she took the first dose because she began to notice lip swelling and throat discomfort.  Patient states that the lip swelling and throat discomfort has not improved but has not worsened.  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  Patient states she she has been prescribed an EpiPen due to a tree nut allergy but did not have it with her.  Patient denies difficulty swallowing or maintaining her airway.  Patient states her throat feels scratchy and tight at this time.   The history is provided by the patient.  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 No pertinent past medical history. Patient Active Problem List   Diagnosis Date Noted   Blood per rectum 11/16/2021   Inflamed external hemorrhoid 11/16/2021   Elevated BP without diagnosis of hypertension 11/16/2021   History of  allergy to shellfish 08/26/2020   Family history of colon cancer 08/26/2020   Hot flash, menopausal 08/26/2020   Past Surgical History:  Procedure Laterality Date   ADENOIDECTOMY     BREAST LUMPECTOMY WITH RADIOACTIVE SEED LOCALIZATION Left 02/25/2022   Procedure: LEFT BREAST LUMPECTOMY WITH RADIOACTIVE SEED LOCALIZATION;  Surgeon: Harriette Bouillon, MD;  Location: [facility];  Service: General;  Laterality: Left;   CESAREAN SECTION     COLONOSCOPY  11/26/2013   normal   TONSILLECTOMY     OB History   No obstetric history on file.    Home Medications    Prior to Admission medications   Medication Sig Start Date End Date Taking? Authorizing Provider  EPINEPHrine 0.3 mg/0.3 mL IJ SOAJ injection Inject 0.3 mLs (0.3 mg total) into the muscle as needed for anaphylaxis. 08/26/20   Nche, Bonna Gains, NP  hydrocortisone (PROCTOSOL HC) 2.5 % rectal cream Place 1 application rectally 2 (two) times daily. 12/08/21   Nche, Bonna Gains, NP  ibuprofen (ADVIL) 800 MG tablet Take 1 tablet (800 mg total) by mouth every 8 (eight) hours as needed. 02/25/22   Cornett, Maisie Fus, MD  Multiple Vitamin (MULTIVITAMIN) tablet Take 1 tablet by mouth daily.    [provider]  oxyCODONE (OXY IR/ROXICODONE) 5 MG immediate release tablet Take 1 tablet (5 mg total) by mouth every 6 (six) hours as needed for severe pain. 02/25/22   Harriette Bouillon, MD    Allergies   Other, Shellfish allergy, and Fish allergy  Triage Vital  Signs ED Triage Vitals  Enc Vitals Group     BP 10/23/21 0827 (!) 147/82     Pulse Rate 10/23/21 0827 72     Resp 10/23/21 0827 18     Temp 10/23/21 0827 98.3 F (36.8 C)     Temp Source 10/23/21 0827 Oral     SpO2 10/23/21 0827 98 %     Weight --      Height --      Head Circumference --      Peak Flow --      Pain Score 10/23/21 0826 5     Pain Loc --      Pain Edu? --      Excl. in GC? --   No data found.  Updated Vital Signs BP (!) 173/103   Pulse 88    Temp 98.3 F (36.8 C)   Resp 20   LMP 02/24/2021 (Within Weeks)   SpO2 98%   LIMITED PHYSICAL EXAM: Vital signs reveal significantly elevated blood pressure with normal heart rate, normal oxygen and normal respirations.  Patient is in no acute distress.  Patient has some mild swelling of her upper and lower lips.  Airway is patent, lung sounds are clear.  UC Couse / Diagnostics / Procedures:     Medications Ordered in UC: Medications  EPINEPHrine (EPI-PEN) injection 0.3 mg (0.3 mg Intramuscular Given 07/12/22 1432)    UC Diagnoses / Final Clinical Impressions(s)   I have reviewed the triage vital signs and the nursing notes.  Pertinent labs & imaging results that were available during my care of the patient were reviewed by me and considered in my medical decision making (see chart for details).    Final diagnoses:  Angioedema of lips, initial encounter  Tree nut allergy  Allergic reaction, initial encounter  Epinephrine 0.3 mg injected shortly after arrival to urgent care.  Patient was transported via EMS to the emergency room for further observation. ED Prescriptions   None    PDMP not reviewed this encounter.  Disposition Upon Discharge:  Condition: stable for discharge Emergency Room via EMS  This office note has been dictated using Dragon speech recognition software.  Unfortunately, this method of dictation can sometimes lead to typographical or grammatical errors.  I apologize for your inconvenience in advance if this occurs.  Please do not hesitate to reach out to me if clarification is needed.      Theadora Rama Scales, PA-C 07/12/22 1440

## 2022-07-12 NOTE — ED Triage Notes (Signed)
Pt here with tree nut allergy and accidentally ingested some creamer with palm oil around 9. Pt has taken 75 mg of benadryl since that time and presents with increasing lip swelling and throat discomfort. Epipen is not on her person.

## 2022-07-12 NOTE — ED Provider Triage Note (Signed)
Emergency Medicine Provider Triage Evaluation Note  Mary Golden , a 56 y.o. female  was evaluated in triage.  Pt complains of allergic reaction after having coffee creamer with pulm oil in it around 930 this morning.  About 30 minutes later patient started having sensation of her throat swelling/voice change and facial swelling.  She has a known allergy to tree nuts and has an EpiPen, but did not have it with her at work.  She took about 50 mg of Benadryl.  She went to urgent care where they gave her 50 mg Benadryl IV, 125 mg Solu-Medrol IV, and 0.3 mg epinephrine.  They sent her to the ER for evaluation.  Patient states that her lip swelling has reduced greatly in the sensation in her throat has improved.  But she says overall she still feels as though her voice is different.  Review of Systems  Positive: As above Negative: Fever, vomiting, shortness of breath  Physical Exam  BP (!) 151/88 (BP Location: Right Arm)   Pulse 90   Temp (!) 97.5 F (36.4 C) (Oral)   Resp 16   Ht 4\' 11"  (1.499 m)   Wt 62.1 kg   LMP 02/24/2021 (Within Weeks)   SpO2 99%   BMI 27.67 kg/m  Gen:   Awake, no distress   Resp:  Normal effort  MSK:   Moves extremities without difficulty  Other:    Medical Decision Making  Medically screening exam initiated at 3:50 PM.  Appropriate orders placed.  Mary Golden was informed that the remainder of the evaluation will be completed by another provider, this initial triage assessment does not replace that evaluation, and the importance of remaining in the ED until their evaluation is complete.     Krissy Orebaugh T, PA-C 07/12/22 1553

## 2022-07-12 NOTE — ED Triage Notes (Signed)
Per EMS-Patient had coffee this AM with dreamer that contains palm oil around 0900 today. Patient has a tree nut allergy.  Patient took Benadryl 50 mg po earlier today.  Patient was given Benadryl 50 mg IV, solumedrol 125 mg IV,and 0.3 mg Epi prior to arrival tot the ED.   Patient and EMS state that the patient's lip swelling has reduced greatly, itching decreased in her throat, and voice has improved.

## 2022-07-12 NOTE — Discharge Instructions (Addendum)
You were seen in the emergency department today after an allergic reaction.  You were given epinephrine as well as steroids, Benadryl, and Pepcid with the urgent care and ambulance staff.  We gave you additional dose of Benadryl in the emergency department.  We are sending home with steroids to start taking tomorrow morning daily for the next 4 days-this medication is prednisone.  Please take it in the morning with food as it can cause stomach upset.  We are also sending home with Benadryl to take every 6 hours as needed for swelling/itching and Pepcid to take every 12 hours as needed for swelling/itching.   We have prescribed you new medication(s) today. Discuss the medications prescribed today with your pharmacist as they can have adverse effects and interactions with your other medicines including over the counter and prescribed medications. Seek medical evaluation if you start to experience new or abnormal symptoms after taking one of these medicines, seek care immediately if you start to experience difficulty breathing, feeling of your throat closing, facial swelling, or rash as these could be indications of a more serious allergic reaction  We have also provided a refill of your EpiPen to use as needed.  If you need to use your EpiPen please return to the emergency department.  Avoid anything you are allergic to.  Please follow-up with your primary care neurologist.  Return to the emergency department for new or worsening symptoms including but not limited to trouble breathing, trouble swallowing, feeling like your throat is closing, worsening of any symptoms, passing out, vomiting, or any other concerns.   Your potassium was mildly low in the ER, please include potassium rich foods in your diet and have this rechecked by your primary care provider, additionally have your blood pressure rechecked as it was elevated in the ER.

## 2022-07-12 NOTE — ED Provider Notes (Signed)
[facility]-EMERGENCY DEPT Provider Note   CSN: [id number] Arrival date & time: 07/12/22  1513     History {Add pertinent medical, surgical, social history, OB history to HPI:1} Chief Complaint  Patient presents with   Allergic Reaction    Mary Golden is a 56 y.o. female who presents to the emergency department via EMS from urgent care for evaluation of allergic reaction.  Patient states that she has an allergy to tree nuts, she drank coffee with a coffee creamer around 9 AM that she did not realize had palm oil in it-she started to notice lip swelling as well as throat discomfort a sensation of throat closing with dyspnea, she took Benadryl and ultimately went to urgent care.  At urgent care she received IM epinephrine, EMS was called, she received Benadryl, Solu-Medrol, and Pepcid in route.  Patient states overall her symptoms are substantially improved, she still feels that her lips are a bit swollen and that she has to clear her throat at times, however overall much better, has not had any worsening of symptoms again status post epi.  He denies wheezing, syncope, vomiting, or rash.  HPI     Home Medications Prior to Admission medications   Medication Sig Start Date End Date Taking? Authorizing Provider  EPINEPHrine 0.3 mg/0.3 mL IJ SOAJ injection Inject 0.3 mLs (0.3 mg total) into the muscle as needed for anaphylaxis. 08/26/20   Nche, Bonna Gains, NP  hydrocortisone (PROCTOSOL HC) 2.5 % rectal cream Place 1 application rectally 2 (two) times daily. 12/08/21   Nche, Bonna Gains, NP  ibuprofen (ADVIL) 800 MG tablet Take 1 tablet (800 mg total) by mouth every 8 (eight) hours as needed. 02/25/22   Cornett, Maisie Fus, MD  Multiple Vitamin (MULTIVITAMIN) tablet Take 1 tablet by mouth daily.    [provider]  oxyCODONE (OXY IR/ROXICODONE) 5 MG immediate release tablet Take 1 tablet (5 mg total) by mouth every 6 (six) hours as needed for severe pain. 02/25/22    Harriette Bouillon, MD      Allergies    Other, Shellfish allergy, and Fish allergy    Review of Systems   Review of Systems  Constitutional:  Negative for chills and fever.  HENT:  Positive for facial swelling.   Respiratory:  Positive for shortness of breath (resolved). Negative for cough.   Gastrointestinal:  Negative for vomiting.  Skin:  Negative for rash.  Neurological:  Negative for syncope.  All other systems reviewed and are negative.   Physical Exam Updated Vital Signs BP 133/80 (BP Location: Left Arm)   Pulse (!) 107   Temp 98.6 F (37 C) (Oral)   Resp 18   Ht 4\' 11"  (1.499 m)   Wt 62.1 kg   LMP 02/24/2021 (Within Weeks)   SpO2 100%   BMI 27.67 kg/m  Physical Exam Vitals and nursing note reviewed.  Constitutional:      General: She is not in acute distress.    Appearance: She is well-developed.  HENT:     Head: Normocephalic and atraumatic.     Right Ear: Ear canal normal. Tympanic membrane is not perforated, erythematous, retracted or bulging.     Left Ear: Ear canal normal. Tympanic membrane is not perforated, erythematous, retracted or bulging.     Ears:     Comments: No mastoid erythema/swelling/tenderness.     Nose:     Right Sinus: No maxillary sinus tenderness or frontal sinus tenderness.     Left Sinus: No  maxillary sinus tenderness or frontal sinus tenderness.     Mouth/Throat:     Pharynx: Uvula midline. No oropharyngeal exudate or posterior oropharyngeal erythema.     Comments: Posterior oropharynx is symmetric appearing. Patient tolerating own secretions without difficulty. No trismus. No drooling. No hot potato voice. No swelling beneath the tongue, submandibular compartment is soft.  Eyes:     General:        Right eye: No discharge.        Left eye: No discharge.     Conjunctiva/sclera: Conjunctivae normal.     Pupils: Pupils are equal, round, and reactive to light.  Cardiovascular:     Rate and Rhythm: Regular rhythm. Tachycardia present.      Pulses:          Radial pulses are 2+ on the right side and 2+ on the left side.     Heart sounds: No murmur heard. Pulmonary:     Effort: Pulmonary effort is normal. No respiratory distress.     Breath sounds: Normal breath sounds. No wheezing, rhonchi or rales.  Abdominal:     General: There is no distension.     Palpations: Abdomen is soft.     Tenderness: There is no abdominal tenderness.  Musculoskeletal:     Cervical back: Normal range of motion and neck supple. No edema or rigidity.  Lymphadenopathy:     Cervical: No cervical adenopathy.  Skin:    General: Skin is warm and dry.     Findings: No rash.  Neurological:     Mental Status: She is alert.  Psychiatric:        Behavior: Behavior normal.     ED Results / Procedures / Treatments   Labs (all labs ordered are listed, but only abnormal results are displayed) Labs Reviewed  CBC - Abnormal; Notable for the following components:      Result Value   RBC 5.16 (*)    All other components within normal limits  BASIC METABOLIC PANEL - Abnormal; Notable for the following components:   Potassium 3.2 (*)    Glucose, Bld 115 (*)    All other components within normal limits    EKG None  Radiology No results found.  Procedures Procedures  {Document cardiac monitor, telemetry assessment procedure when appropriate:1}  Medications Ordered in ED Medications  diphenhydrAMINE (BENADRYL) injection 25 mg (25 mg Intravenous Given 07/12/22 2332)    ED Course/ Medical Decision Making/ A&P                           Medical Decision Making Risk Prescription drug management.  Patient presents to the ED for evaluation of allergic reaction S/p epi @ urgent care & solumedrol/benadryl/pepcid w/ EMS. Currently feeling much better, remains with mild residual symptoms however has not had rebound S/p epi and has been in the ED for > 8 hours.  On exam patient is nontoxic, resting comfortably, no angioedema noted, tolerating  secretions without difficulty at this time, airway is patent.  Given patient still feels she has little bit of swelling additional dose of Benadryl given at this time.  Given extended observation status post epi feel the patient is reasonable for discharge.  Provided her with an EpiPen, short course of steroids, Pepcid, and Benadryl to go home with with allergist follow-up and strict return precautions. I discussed treatment plan, need for follow-up, and return precautions with the patient. Provided opportunity for questions, patient confirmed understanding  and is in agreement with plan.    Final Clinical Impression(s) / ED Diagnoses Final diagnoses:  None    Rx / DC Orders ED Discharge Orders     None

## 2022-09-27 ENCOUNTER — Other Ambulatory Visit: Payer: Self-pay | Admitting: Nurse Practitioner

## 2022-09-27 DIAGNOSIS — Z9889 Other specified postprocedural states: Secondary | ICD-10-CM

## 2022-11-10 ENCOUNTER — Telehealth: Payer: Self-pay | Admitting: Nurse Practitioner

## 2022-11-10 DIAGNOSIS — Z800 Family history of malignant neoplasm of digestive organs: Secondary | ICD-10-CM

## 2022-11-10 NOTE — Telephone Encounter (Signed)
Pt is wanting an order placed for her to go get a colonoscopy. She is wanting to go to [facility]. The fax # [phone].

## 2022-11-11 NOTE — Addendum Note (Signed)
Addended by: Burnell Blanks on: 11/11/2022 03:24 PM   Modules accepted: Orders

## 2022-11-15 ENCOUNTER — Other Ambulatory Visit: Payer: Self-pay | Admitting: Nurse Practitioner

## 2022-11-15 ENCOUNTER — Ambulatory Visit
Admission: RE | Admit: 2022-11-15 | Discharge: 2022-11-15 | Disposition: A | Discharge status: Home / Self Care | Payer: BC Managed Care – PPO | Source: Ambulatory Visit | Attending: Nurse Practitioner | Admitting: Nurse Practitioner

## 2022-11-15 DIAGNOSIS — Z139 Encounter for screening, unspecified: Secondary | ICD-10-CM

## 2022-11-15 DIAGNOSIS — Z9889 Other specified postprocedural states: Secondary | ICD-10-CM

## 2022-11-19 ENCOUNTER — Encounter: Payer: BC Managed Care – PPO | Admitting: Nurse Practitioner

## 2022-11-24 ENCOUNTER — Ambulatory Visit (INDEPENDENT_AMBULATORY_CARE_PROVIDER_SITE_OTHER): Payer: BC Managed Care – PPO | Admitting: Nurse Practitioner

## 2022-11-24 ENCOUNTER — Encounter: Payer: Self-pay | Admitting: Nurse Practitioner

## 2022-11-24 VITALS — BP 126/86 | HR 98 | Temp 96.6°F | Ht 59.0 in | Wt 134.0 lb

## 2022-11-24 DIAGNOSIS — Z0000 Encounter for general adult medical examination without abnormal findings: Secondary | ICD-10-CM | POA: Diagnosis not present

## 2022-11-24 DIAGNOSIS — Z13220 Encounter for screening for lipoid disorders: Secondary | ICD-10-CM | POA: Diagnosis not present

## 2022-11-24 DIAGNOSIS — Z9889 Other specified postprocedural states: Secondary | ICD-10-CM | POA: Insufficient documentation

## 2022-11-24 DIAGNOSIS — Z91018 Allergy to other foods: Secondary | ICD-10-CM | POA: Insufficient documentation

## 2022-11-24 DIAGNOSIS — Z136 Encounter for screening for cardiovascular disorders: Secondary | ICD-10-CM | POA: Diagnosis not present

## 2022-11-24 LAB — COMPREHENSIVE METABOLIC PANEL
ALT: 13 U/L (ref 0–35)
AST: 13 U/L (ref 0–37)
Albumin: 4.3 g/dL (ref 3.5–5.2)
Alkaline Phosphatase: 89 U/L (ref 39–117)
BUN: 13 mg/dL (ref 6–23)
CO2: 29 mEq/L (ref 19–32)
Calcium: 9.4 mg/dL (ref 8.4–10.5)
Chloride: 106 mEq/L (ref 96–112)
Creatinine, Ser: 0.7 mg/dL (ref 0.40–1.20)
GFR: 97.89 mL/min (ref >60.00)
Glucose, Bld: 106 mg/dL — ABNORMAL HIGH (ref 70–99)
Potassium: 4.3 mEq/L (ref 3.5–5.1)
Sodium: 140 mEq/L (ref 135–145)
Total Bilirubin: 0.3 mg/dL (ref 0.2–1.2)
Total Protein: 7.3 g/dL (ref 6.0–8.3)

## 2022-11-24 LAB — LIPID PANEL
Cholesterol: 199 mg/dL (ref 0–200)
HDL: 53.6 mg/dL (ref >39.00)
LDL Cholesterol: 124 mg/dL — ABNORMAL HIGH (ref 0–99)
NonHDL: 145.24
Total CHOL/HDL Ratio: 4
Triglycerides: 105 mg/dL (ref 0.0–149.0)
VLDL: 21 mg/dL (ref 0.0–40.0)

## 2022-11-24 NOTE — Assessment & Plan Note (Signed)
02/2022: With radioactive seed localization. 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 No atypia. Sclerosing adenosis with calcifications, consistent with biopsy findings. No malignancy identified.

## 2022-11-24 NOTE — Patient Instructions (Addendum)
Sign medical release to get records from [facility].  Go to lab  Preventive Care 54-54 Years Old, Female Preventive care refers to lifestyle choices and visits with your health care provider that can promote health and wellness. Preventive care visits are also called wellness exams. What can I expect for my preventive care visit? Counseling Your health care provider may ask you questions about your: Medical history, including: Past medical problems. Family medical history. Pregnancy history. Current health, including: Menstrual cycle. Method of birth control. Emotional well-being. Home life and relationship well-being. Sexual activity and sexual health. Lifestyle, including: Alcohol, nicotine or tobacco, and drug use. Access to firearms. Diet, exercise, and sleep habits. Work and work Statistician. Sunscreen use. Safety issues such as seatbelt and bike helmet use. Physical exam Your health care provider will check your: Height and weight. These may be used to calculate your BMI (body mass index). BMI is a measurement that tells if you are at a healthy weight. Waist circumference. This measures the distance around your waistline. This measurement also tells if you are at a healthy weight and may help predict your risk of certain diseases, such as type 2 diabetes and high blood pressure. Heart rate and blood pressure. Body temperature. Skin for abnormal spots. What immunizations do I need?  Vaccines are usually given at various ages, according to a schedule. Your health care provider will recommend vaccines for you based on your age, medical history, and lifestyle or other factors, such as travel or where you work. What tests do I need? Screening Your health care provider may recommend screening tests for certain conditions. This may include: Lipid and cholesterol levels. Diabetes screening. This is done by checking your blood sugar (glucose) after you have not  eaten for a while (fasting). Pelvic exam and Pap test. Hepatitis B test. Hepatitis C test. HIV (human immunodeficiency virus) test. STI (sexually transmitted infection) testing, if you are at risk. Lung cancer screening. Colorectal cancer screening. Mammogram. Talk with your health care provider about when you should start having regular mammograms. This may depend on whether you have a family history of breast cancer. BRCA-related cancer screening. This may be done if you have a family history of breast, ovarian, tubal, or peritoneal cancers. Bone density scan. This is done to screen for osteoporosis. Talk with your health care provider about your test results, treatment options, and if necessary, the need for more tests. Follow these instructions at home: Eating and drinking  Eat a diet that includes fresh fruits and vegetables, whole grains, lean protein, and low-fat dairy products. Take vitamin and mineral supplements as recommended by your health care provider. Do not drink alcohol if: Your health care provider tells you not to drink. You are pregnant, may be pregnant, or are planning to become pregnant. If you drink alcohol: Limit how much you have to 0-1 drink a day. Know how much alcohol is in your drink. In the [place] one drink equals one 12 oz bottle of beer (355 mL), one 5 oz glass of wine (148 mL), or one 1 oz glass of hard liquor (44 mL). Lifestyle Brush your teeth every morning and night with fluoride toothpaste. Floss one time each day. Exercise for at least 30 minutes 5 or more days each week. Do not use any products that contain nicotine or tobacco. These products include cigarettes, chewing tobacco, and vaping devices, such as e-cigarettes. If you need help quitting, ask your health care provider. Do not use drugs. If you  are sexually active, practice safe sex. Use a condom or other form of protection to prevent STIs. If you do not wish to become pregnant, use a form of  birth control. If you plan to become pregnant, see your health care provider for a prepregnancy visit. Take aspirin only as told by your health care provider. Make sure that you understand how much to take and what form to take. Work with your health care provider to find out whether it is safe and beneficial for you to take aspirin daily. Find healthy ways to manage stress, such as: Meditation, yoga, or listening to music. Journaling. Talking to a trusted person. Spending time with friends and family. Minimize exposure to UV radiation to reduce your risk of skin cancer. Safety Always wear your seat belt while driving or riding in a vehicle. Do not drive: If you have been drinking alcohol. Do not ride with someone who has been drinking. When you are tired or distracted. While texting. If you have been using any mind-altering substances or drugs. Wear a helmet and other protective equipment during sports activities. If you have firearms in your house, make sure you follow all gun safety procedures. Seek help if you have been physically or sexually abused. What's next? Visit your health care provider once a year for an annual wellness visit. Ask your health care provider how often you should have your eyes and teeth checked. Stay up to date on all vaccines. This information is not intended to replace advice given to you by your health care provider. Make sure you discuss any questions you have with your health care provider. Document Revised: 06/10/2021 Document Reviewed: 06/10/2021 Elsevier Patient Education  [place].

## 2022-11-24 NOTE — Progress Notes (Signed)
[facility] for nutrition counseling    Complete physical exam  Patient: Mary Golden   DOB: 1966/02/20   56 y.o. Female  MRN: [id number] Visit Date: 11/24/2022  Subjective:    Chief Complaint  Patient presents with  . Annual Exam    CPE Pt fasting  No concerns    Mary Golden is a 56 y.o. female who presents today for a complete physical exam. She reports consuming a low fat and low sodium diet.  Pilates daily  She generally feels well. She reports sleeping well. She does not have additional problems to discuss today.  Vision:Yes Dental:Yes STD Screen:No  Wt Readings from Last 3 Encounters:  11/24/22 134 lb (60.8 kg)  07/12/22 137 lb (62.1 kg)  02/25/22 136 lb 0.4 oz (61.7 kg)    Resolved elevated BP with lifestyle modifications. Under the care of nutritionist with [facility] BP Readings from Last 3 Encounters:  11/24/22 126/86  07/12/22 133/80  07/12/22 (!) 173/103    Most recent fall risk assessment:    11/16/2021    1:29 PM  Fall Risk   Falls in the past year? 0  Number falls in past yr: 0  Injury with Fall? 0  Risk for fall due to : No Fall Risks  Follow up Falls evaluation completed   Depression screen:Yes - No Depression  Most recent depression screenings:    11/24/2022   10:05 AM 11/16/2021    2:26 PM  PHQ 2/9 Scores  PHQ - 2 Score 2 0  PHQ- 9 Score 9 3   HPI  S/P lumpectomy, left breast 02/2022: With radioactive seed localization. 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 No atypia. Sclerosing adenosis with calcifications, consistent with biopsy findings. No malignancy identified.    Past Medical History:  Diagnosis Date  . Elevated BP without diagnosis of hypertension 11/16/2021   Past Surgical History:  Procedure Laterality Date  . ADENOIDECTOMY    . BREAST BIOPSY Left 12/24/2021  . BREAST EXCISIONAL BIOPSY Left 02/25/2022  . BREAST LUMPECTOMY WITH RADIOACTIVE SEED LOCALIZATION Left  02/25/2022   Procedure: LEFT BREAST LUMPECTOMY WITH RADIOACTIVE SEED LOCALIZATION;  Surgeon: Harriette Bouillon, MD;  Location: [facility];  Service: General;  Laterality: Left;  . CESAREAN SECTION    . COLONOSCOPY  11/26/2013   normal  . TONSILLECTOMY     Social History   Socioeconomic History  . Marital status: Divorced    Spouse name: Not on file  . Number of children: 2  . Years of education: Not on file  . Highest education level: Not on file  Occupational History  . Not on file  Tobacco Use  . Smoking status: Never  . Smokeless tobacco: Never  Vaping Use  . Vaping Use: Never used  Substance and Sexual Activity  . Alcohol use: No  . Drug use: No  . Sexual activity: Yes    Birth control/protection: Post-menopausal  Other Topics Concern  . Not on file  Social History Narrative  . Not on file   Social Determinants of Health   Financial Resource Strain: Not on file  Food Insecurity: Not on file  Transportation Needs: Not on file  Physical Activity: Not on file  Stress: Not on file  Social Connections: Not on file  Intimate Partner Violence: Not on file   Family Status  Relation Name Status  . Mother  Deceased  . Father  Deceased  . Sister  Alive  . Daughter  Alive  .  Son  Alive   Family History  Problem Relation Age of Onset  . Cancer Mother 41       colon  . Cancer Father        lung   Allergies  Allergen Reactions  . Other Anaphylaxis    All Tree Nuts, palm oil  . Shellfish Allergy Anaphylaxis    All fish  . Fish Allergy     Patient Care Team: Kyriakos Babler, Bonna Gains, NP as PCP - General (Internal Medicine)   Medications: Outpatient Medications Prior to Visit  Medication Sig  . diphenhydrAMINE (BENADRYL) 25 MG tablet Take 1 tablet (25 mg total) by mouth every 6 (six) hours as needed.  Marland Kitchen EPINEPHrine 0.3 mg/0.3 mL IJ SOAJ injection Inject 0.3 mg into the muscle as needed for anaphylaxis.  . hydrocortisone (PROCTOSOL HC) 2.5 % rectal  cream Place 1 application rectally 2 (two) times daily.  Marland Kitchen ibuprofen (ADVIL) 800 MG tablet Take 1 tablet (800 mg total) by mouth every 8 (eight) hours as needed.  . Multiple Vitamin (MULTIVITAMIN) tablet Take 1 tablet by mouth daily.  . [DISCONTINUED] oxyCODONE (OXY IR/ROXICODONE) 5 MG immediate release tablet Take 1 tablet (5 mg total) by mouth every 6 (six) hours as needed for severe pain.  . [DISCONTINUED] famotidine (PEPCID) 20 MG tablet Take 1 tablet (20 mg total) by mouth 2 (two) times daily. (Patient not taking: Reported on 11/24/2022)   No facility-administered medications prior to visit.    Review of Systems  Constitutional:  Negative for fever.  HENT:  Negative for congestion and sore throat.   Eyes:        Negative for visual changes  Respiratory:  Negative for cough and shortness of breath.   Cardiovascular:  Negative for chest pain, palpitations and leg swelling.  Gastrointestinal:  Negative for blood in stool, constipation and diarrhea.  Genitourinary:  Negative for dysuria, frequency and urgency.  Musculoskeletal:  Negative for myalgias.  Skin:  Negative for rash.  Neurological:  Negative for dizziness and headaches.  Hematological:  Does not bruise/bleed easily.  Psychiatric/Behavioral:  Negative for suicidal ideas. The patient is not nervous/anxious.        Objective:  BP 126/86 (BP Location: Right Arm, Patient Position: Sitting, Cuff Size: Normal)   Pulse 98   Temp (!) 96.6 F (35.9 C) (Temporal)   Ht 4\' 11"  (1.499 m)   Wt 134 lb (60.8 kg)   LMP 02/24/2021 (Within Weeks)   SpO2 99%   BMI 27.06 kg/m     Physical Exam Vitals and nursing note reviewed.  Constitutional:      General: She is not in acute distress. HENT:     Right Ear: Tympanic membrane, ear canal and external ear normal.     Left Ear: Tympanic membrane, ear canal and external ear normal.     Nose: Nose normal.  Eyes:     General: No scleral icterus.    Extraocular Movements: Extraocular  movements intact.     Conjunctiva/sclera: Conjunctivae normal.     Pupils: Pupils are equal, round, and reactive to light.  Cardiovascular:     Rate and Rhythm: Normal rate and regular rhythm.     Pulses: Normal pulses.     Heart sounds: Normal heart sounds.  Pulmonary:     Effort: Pulmonary effort is normal. No respiratory distress.     Breath sounds: Normal breath sounds.  Abdominal:     General: Bowel sounds are normal. There is no distension.  Palpations: Abdomen is soft.  Musculoskeletal:        General: Normal range of motion.     Cervical back: Normal range of motion and neck supple.     Right lower leg: No edema.     Left lower leg: No edema.  Lymphadenopathy:     Cervical: No cervical adenopathy.  Skin:    General: Skin is warm and dry.  Neurological:     Mental Status: She is alert and oriented to person, place, and time.  Psychiatric:        Mood and Affect: Mood normal.        Behavior: Behavior normal.        Thought Content: Thought content normal.     No results found for any visits on 11/24/22.    Assessment & Plan:    Routine Health Maintenance and Physical Exam  Immunization History  Administered Date(s) Administered  . Influenza-Unspecified 10/22/2021, 09/27/2022  . PFIZER(Purple Top)SARS-COV-2 Vaccination 02/23/2020, 03/15/2020, 09/30/2020  . Tdap 09/13/2011, 11/16/2021  . Zoster Recombinat (Shingrix) 11/16/2021, 02/16/2022    Health Maintenance  Topic Date Due  . COLONOSCOPY (Pts 45-54yrs Insurance coverage will need to be confirmed)  12/10/2018  . COVID-19 Vaccine (4 - 2023-24 season) 12/10/2022 (Originally 08/27/2022)  . Hepatitis C Screening  11/25/2023 (Originally 03/28/1986)  . MAMMOGRAM  11/15/2024  . PAP SMEAR-Modifier  11/16/2024  . INFLUENZA VACCINE  Completed  . HIV Screening  Completed  . Zoster Vaccines- Shingrix  Completed  . HPV VACCINES  Aged Out    Discussed health benefits of physical activity, and encouraged her to engage  in regular exercise appropriate for her age and condition.  Problem List Items Addressed This Visit   None Visit Diagnoses     Preventative health care    -  Primary   Relevant Orders   Comprehensive metabolic panel   Lipid panel   Encounter for lipid screening for cardiovascular disease       Relevant Orders   Lipid panel      Return in about 1 year (around 11/25/2023) for CPE (fasting).     Alysia Penna, NP

## 2023-07-20 IMAGING — MG MM PLC BREAST LOC DEV 1ST LESION INC MAMMO GUIDE*L*
8 of 9 series · 8 of 9 positions shown · non-contrast
Comparison: Previous exam(s).

CLINICAL DATA: Biopsy proven complex sclerosing lesion in the left
breast. Radioactive seed localization prior to surgery requested.

EXAM:
MAMMOGRAPHIC GUIDED RADIOACTIVE SEED LOCALIZATION OF THE LEFT BREAST

[L ML (1 of 6)]
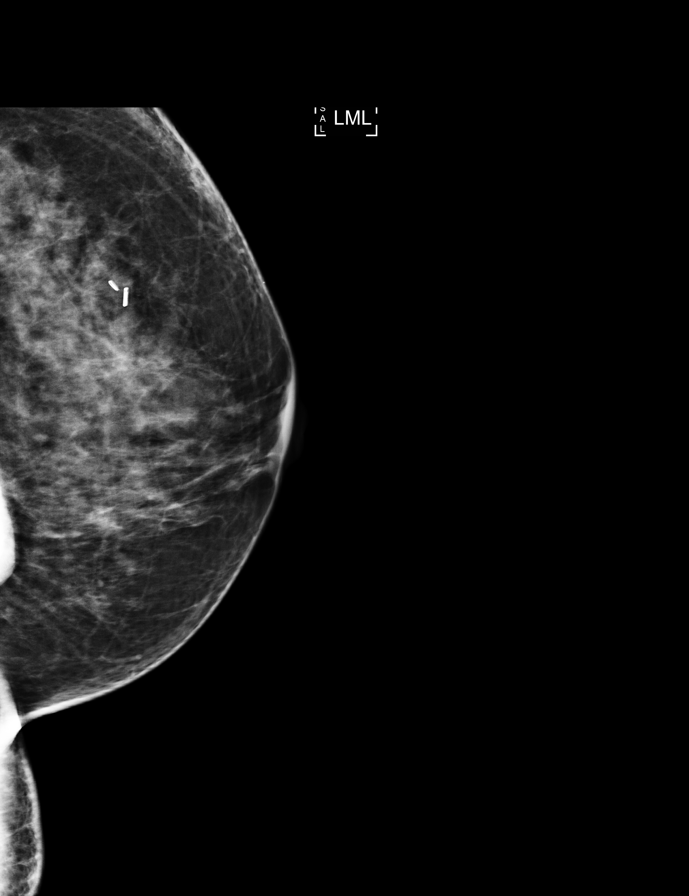

[L ML (2 of 6)]
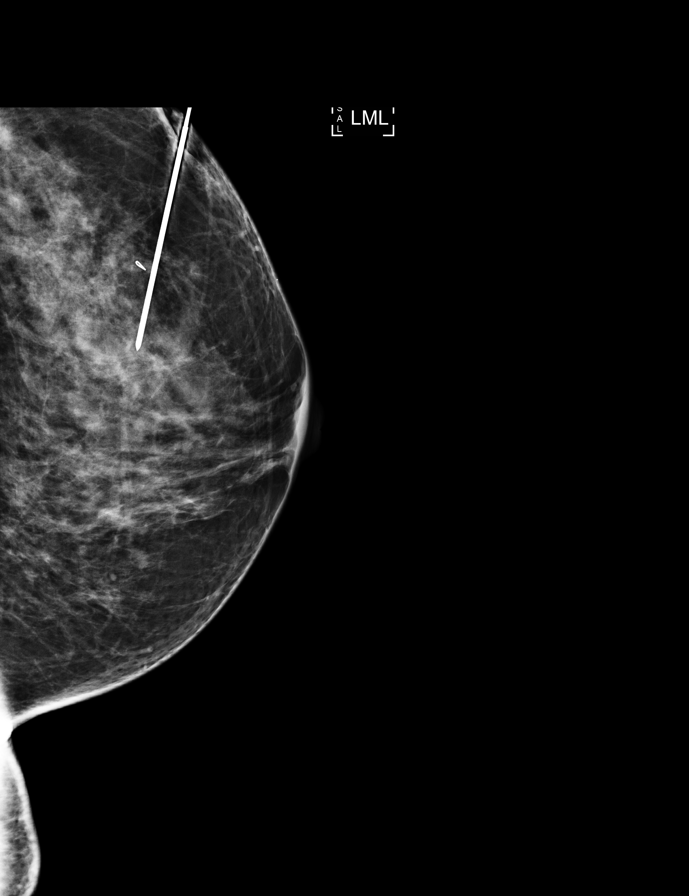

[L CC (1 of 2)]
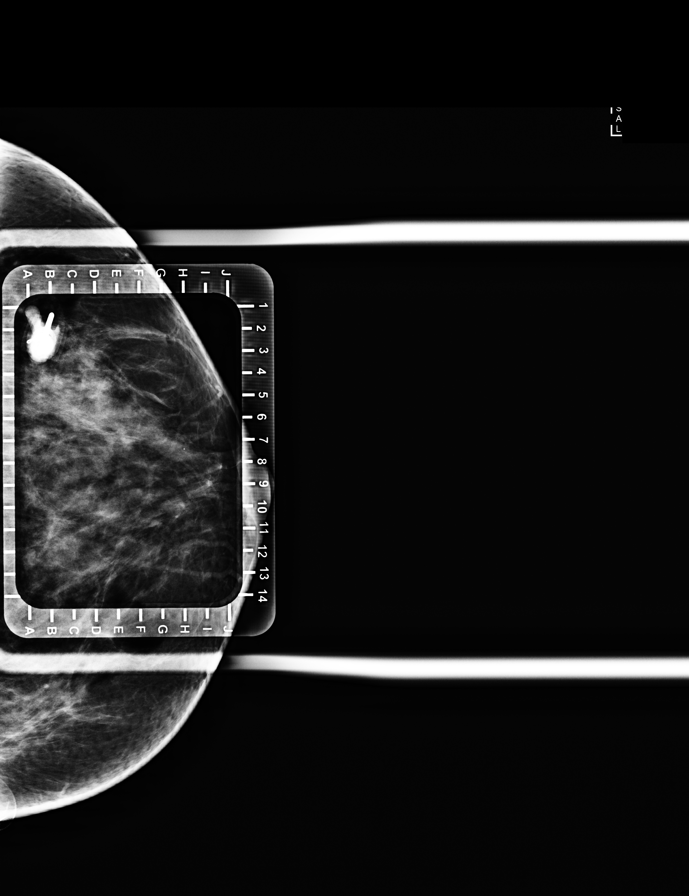

[L ML (3 of 6)]
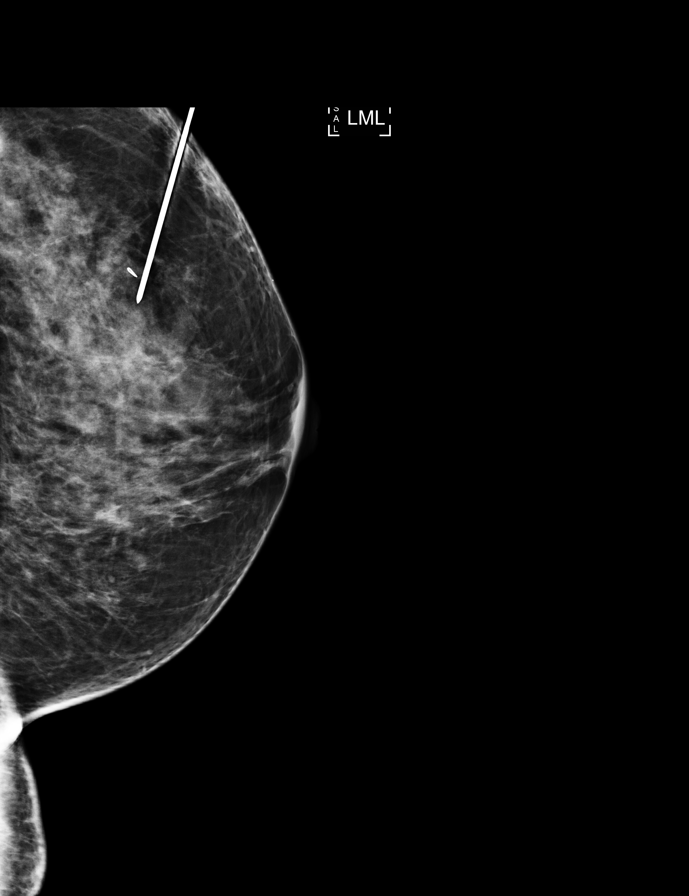

[L ML (4 of 6)]
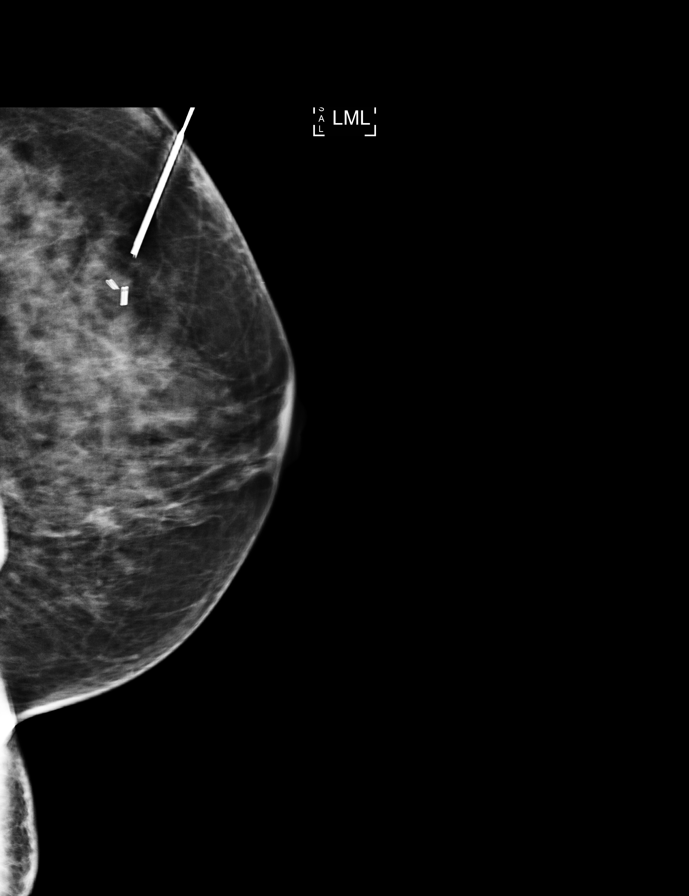

[L ML (5 of 6)]
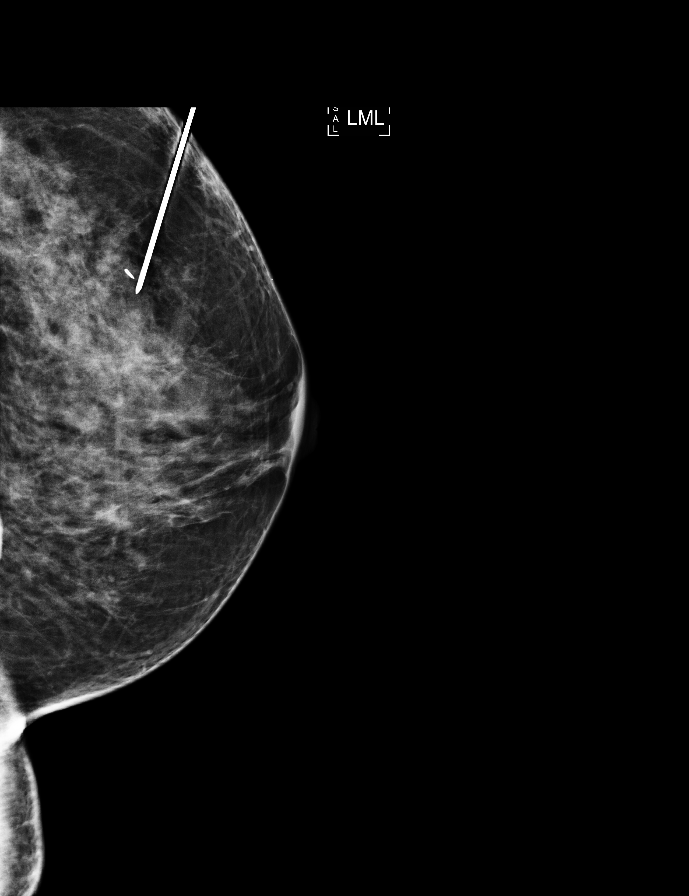

[L CC (2 of 2)]
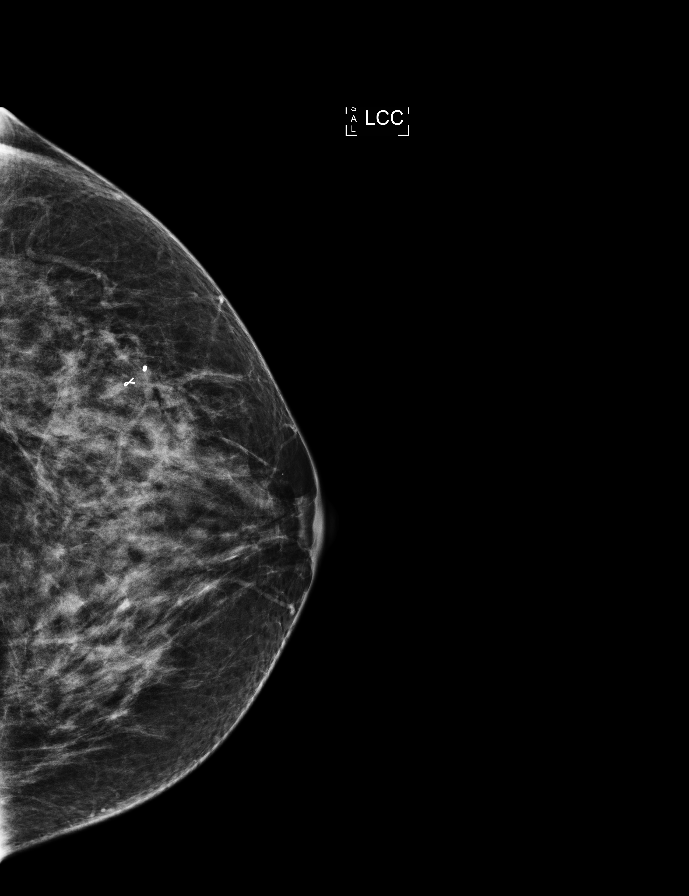

[L ML (6 of 6)]
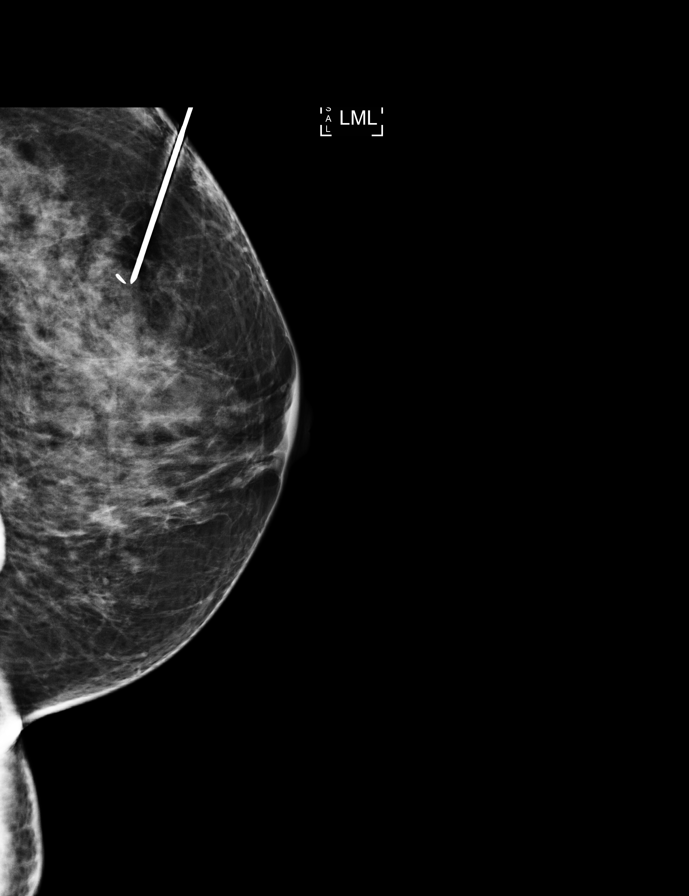

[8 of 9 positions shown; findings below may reference images not displayed]



The usual time-out protocol was performed immediately prior to the
procedure.

Using mammographic guidance, sterile technique, 1% lidocaine and an
Y-HOC radioactive seed, the ribbon shaped clip was localized using a
superior to inferior approach. The follow-up mammogram images
confirm the seed in the expected location and were marked for Dr.
Lyse.

Follow-up survey of the patient confirms presence of the radioactive
seed.

Order number of Y-HOC seed:  [id number].

Total activity:  0.251 millicuries reference Date: 02/04/2022

The patient tolerated the procedure well and was released from the
[REDACTED]. She was given instructions regarding seed removal.
IMPRESSION: Radioactive seed localization left breast. No apparent
complications.

## 2023-07-21 IMAGING — MG MM BREAST SURGICAL SPECIMEN
1 series · 2 of 2 positions shown · non-contrast
Comparison: Previous exam(s).

CLINICAL DATA: Surgical excision of a left breast complex
sclerosing lesion, marked with a ribbon shaped biopsy marker clip.

EXAM:
SPECIMEN RADIOGRAPH OF THE LEFT BREAST

[Series 1: L · left · 0.07mm/px · 2 of 2 slices shown]
[im 1/2]
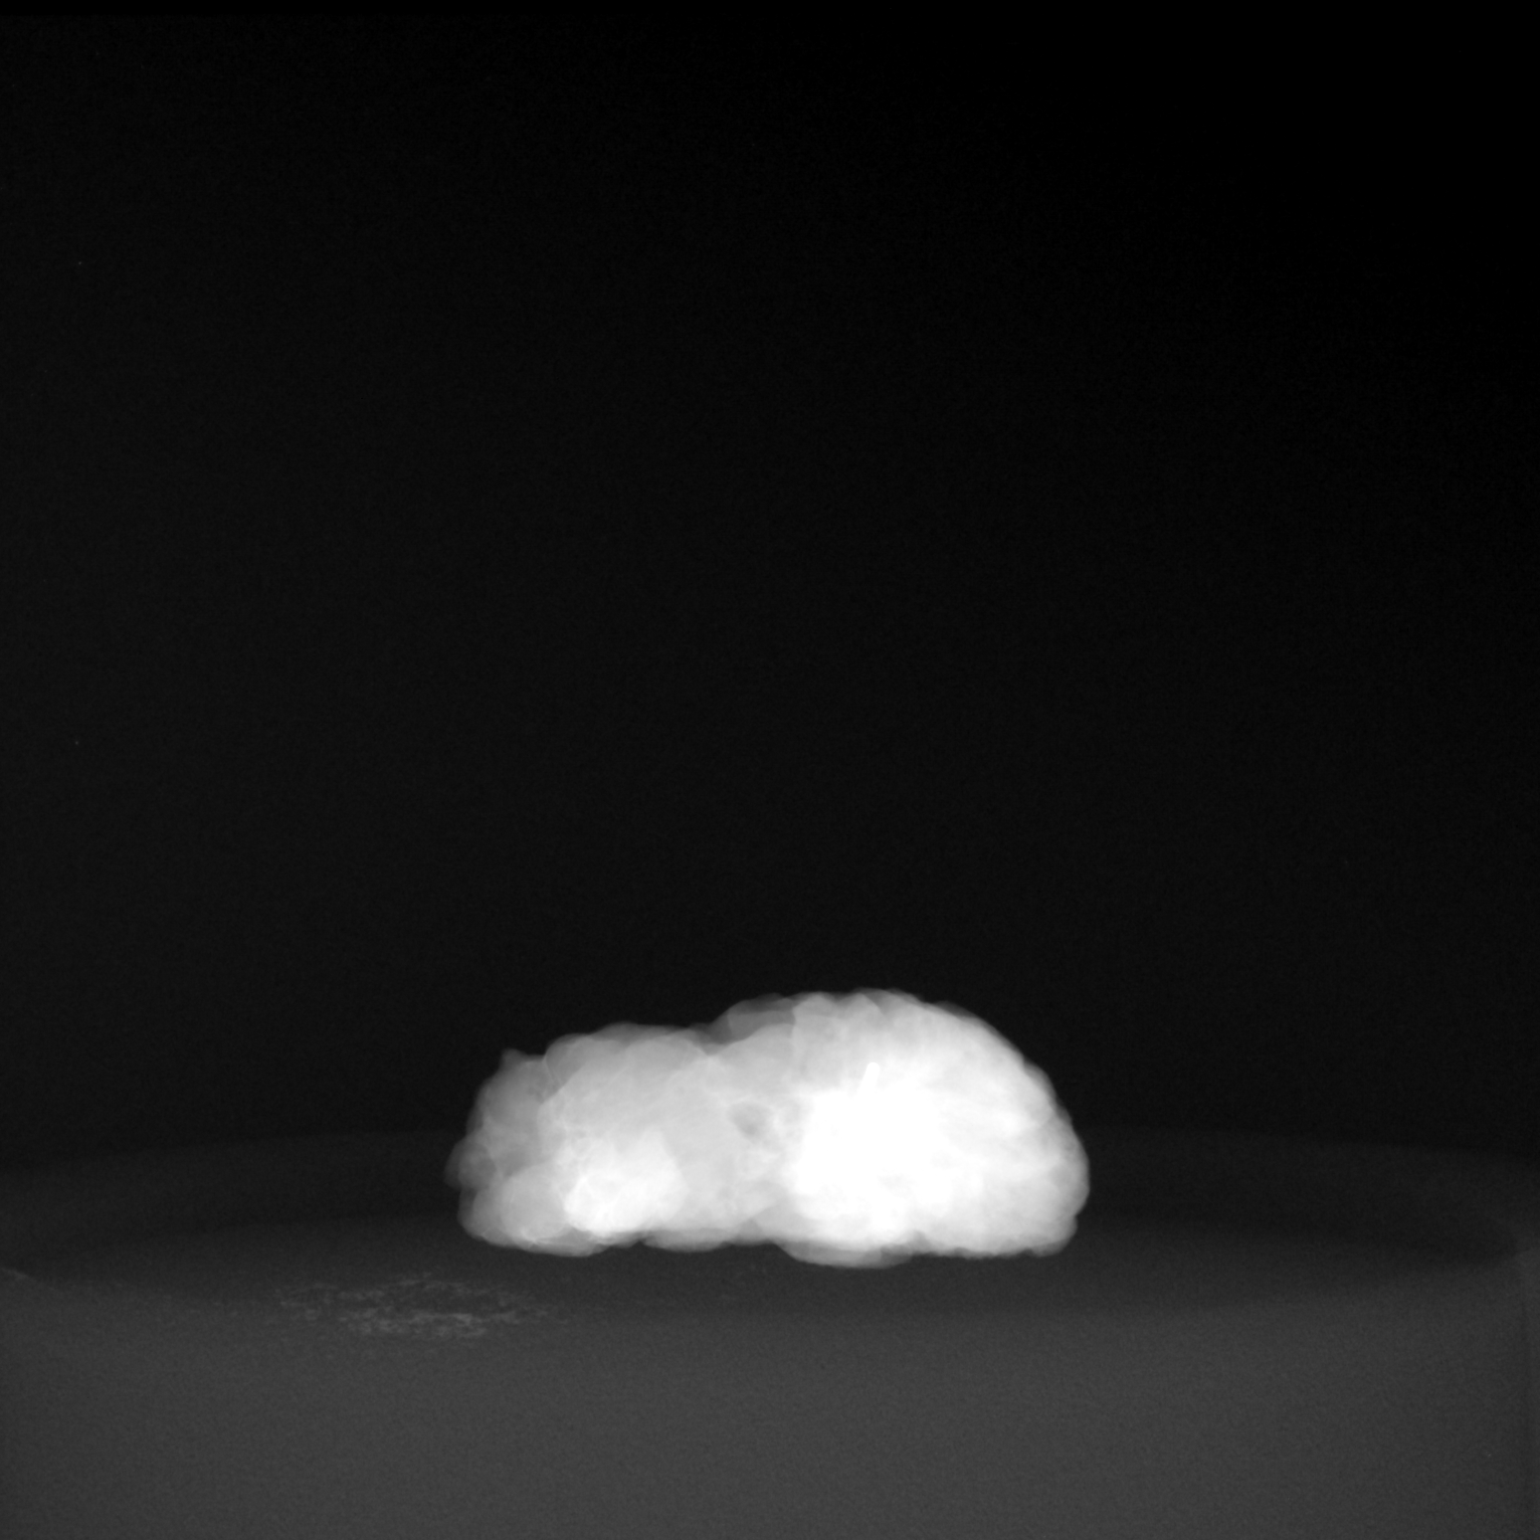
[im 2/2]
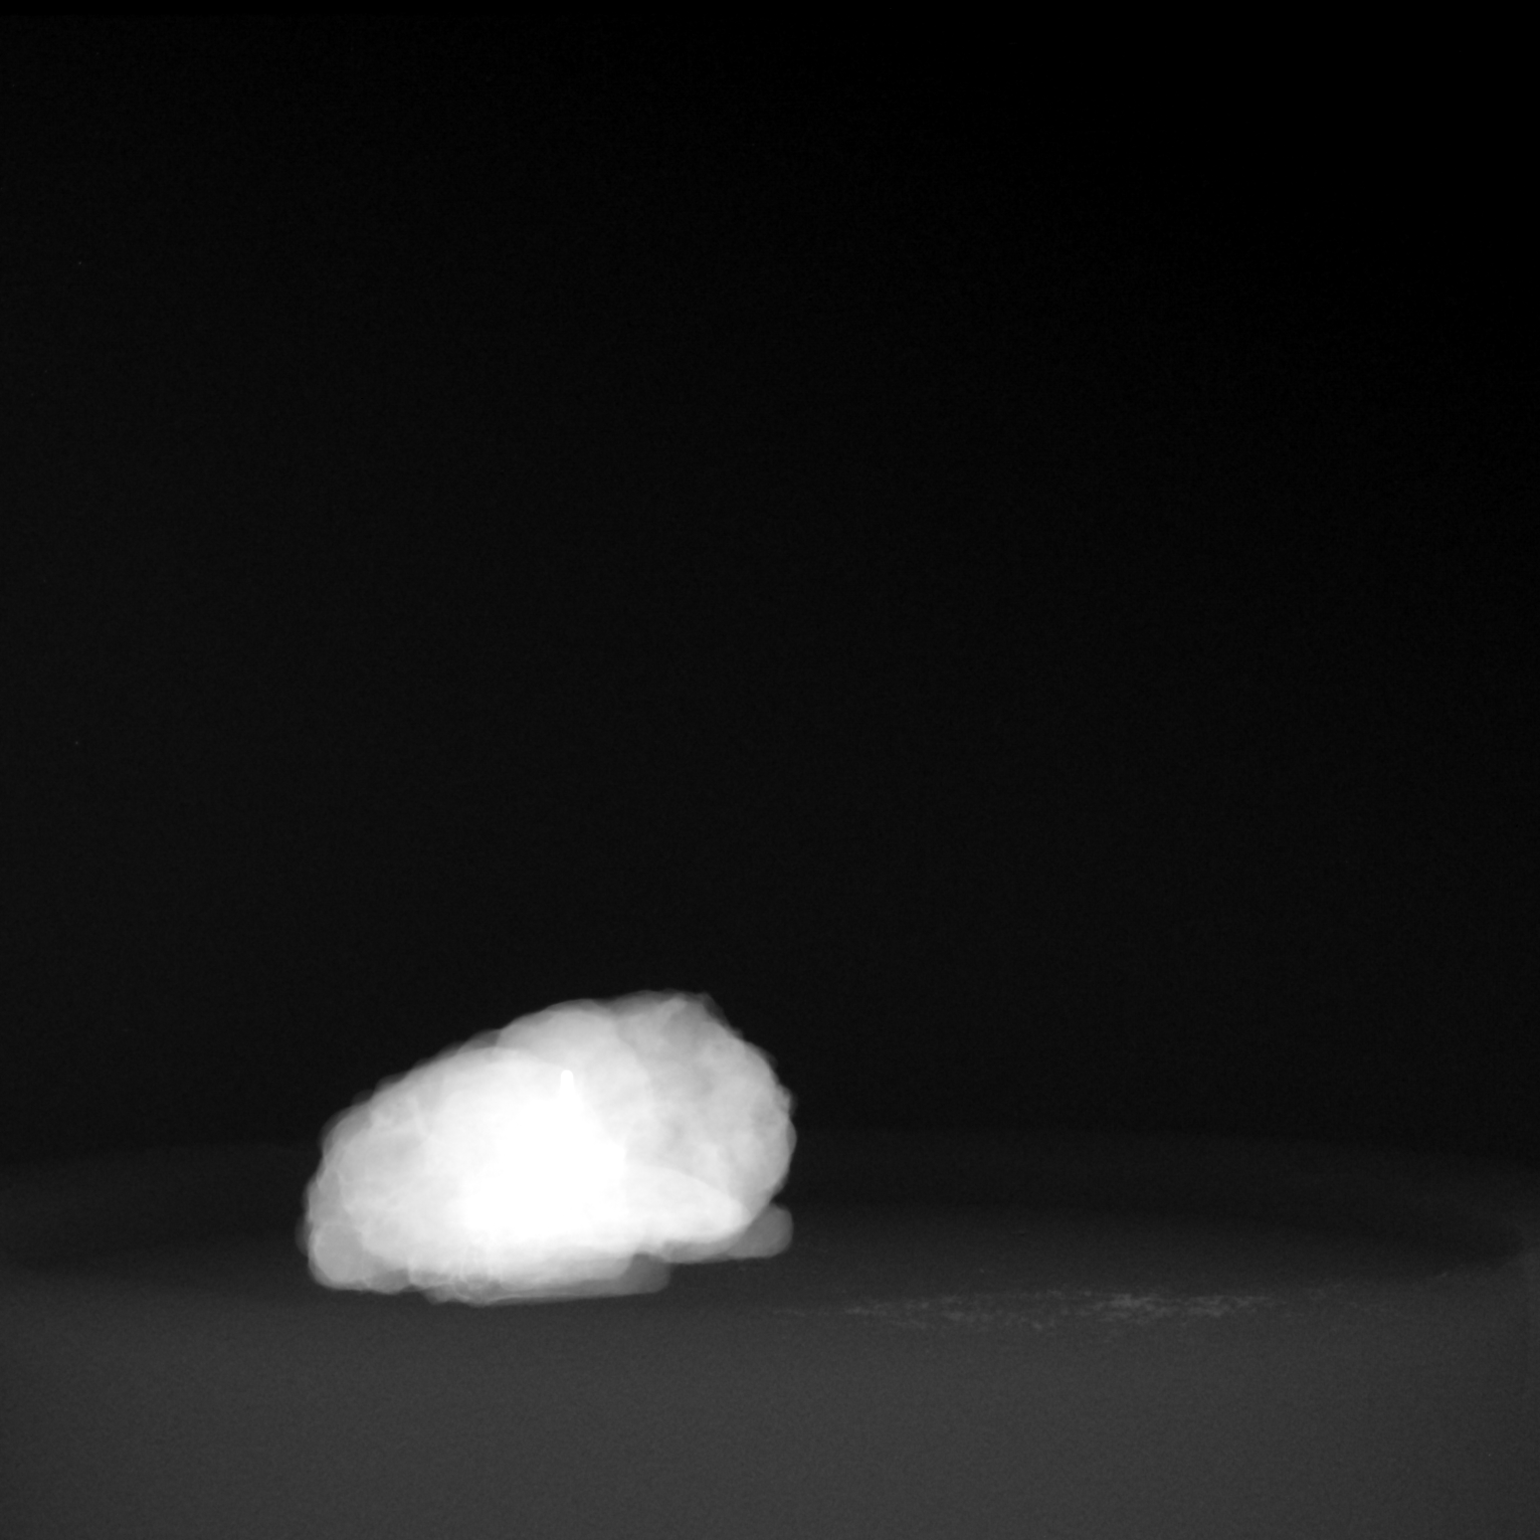

[2 of 2 positions shown; findings below may reference images not displayed]

FINDINGS: Status post excision of the left breast. The radioactive seed and
ribbon shaped biopsy marker clip are present, completely intact.
IMPRESSION: Specimen radiograph of the left breast.

## 2023-10-27 ENCOUNTER — Other Ambulatory Visit: Payer: Self-pay | Admitting: Nurse Practitioner

## 2023-10-27 DIAGNOSIS — Z1211 Encounter for screening for malignant neoplasm of colon: Secondary | ICD-10-CM

## 2023-11-08 ENCOUNTER — Other Ambulatory Visit: Payer: Self-pay | Admitting: Nurse Practitioner

## 2023-11-08 DIAGNOSIS — Z1231 Encounter for screening mammogram for malignant neoplasm of breast: Secondary | ICD-10-CM

## 2023-11-30 ENCOUNTER — Encounter: Payer: Self-pay | Admitting: Nurse Practitioner

## 2023-11-30 ENCOUNTER — Ambulatory Visit: Payer: BC Managed Care – PPO | Admitting: Nurse Practitioner

## 2023-11-30 VITALS — BP 127/87 | HR 80 | Temp 98.4°F | Resp 18 | Ht 59.0 in | Wt 127.6 lb

## 2023-11-30 DIAGNOSIS — Z0001 Encounter for general adult medical examination with abnormal findings: Secondary | ICD-10-CM

## 2023-11-30 DIAGNOSIS — R7301 Impaired fasting glucose: Secondary | ICD-10-CM | POA: Diagnosis not present

## 2023-11-30 DIAGNOSIS — E7800 Pure hypercholesterolemia, unspecified: Secondary | ICD-10-CM

## 2023-11-30 LAB — HEMOGLOBIN A1C: Hgb A1c MFr Bld: 6.2 % (ref 4.6–6.5)

## 2023-11-30 LAB — LIPID PANEL
Cholesterol: 245 mg/dL — ABNORMAL HIGH (ref 0–200)
HDL: 61 mg/dL (ref >39.00)
LDL Cholesterol: 167 mg/dL — ABNORMAL HIGH (ref 0–99)
NonHDL: 183.81
Total CHOL/HDL Ratio: 4
Triglycerides: 82 mg/dL (ref 0.0–149.0)
VLDL: 16.4 mg/dL (ref 0.0–40.0)

## 2023-11-30 LAB — COMPREHENSIVE METABOLIC PANEL
ALT: 18 U/L (ref 0–35)
AST: 20 U/L (ref 0–37)
Albumin: 4.8 g/dL (ref 3.5–5.2)
Alkaline Phosphatase: 94 U/L (ref 39–117)
BUN: 20 mg/dL (ref 6–23)
CO2: 30 meq/L (ref 19–32)
Calcium: 9.8 mg/dL (ref 8.4–10.5)
Chloride: 104 meq/L (ref 96–112)
Creatinine, Ser: 0.85 mg/dL (ref 0.40–1.20)
GFR: 76.99 mL/min (ref >60.00)
Glucose, Bld: 102 mg/dL — ABNORMAL HIGH (ref 70–99)
Potassium: 4 meq/L (ref 3.5–5.1)
Sodium: 140 meq/L (ref 135–145)
Total Bilirubin: 0.5 mg/dL (ref 0.2–1.2)
Total Protein: 8.3 g/dL (ref 6.0–8.3)

## 2023-11-30 NOTE — Addendum Note (Signed)
Addended by: Michaela Corner on: 11/30/2023 03:31 PM   Modules accepted: Orders

## 2023-11-30 NOTE — Patient Instructions (Signed)
Go to lab Continue Heart healthy diet and daily exercise. Maintain current medications. Schedule appointment for colonoscopy

## 2023-11-30 NOTE — Progress Notes (Signed)
Complete physical exam  Patient: Mary Golden   DOB: July 27, 1966   57 y.o. Female  MRN: [id number] Visit Date: 11/30/2023  Subjective:    Chief Complaint  Patient presents with   Annual Exam    PT is due for colonoscopy    Mary Golden is a 57 y.o. female who presents today for a complete physical exam. She reports consuming a general diet.  Pilates and golf daily  She generally feels well. She reports sleeping well. She does not have additional problems to discuss today.  Vision:Yes Dental:Yes STD Screen:No She plans to schedule appointment for repeat colonoscopy.  BP Readings from Last 3 Encounters:  11/30/23 127/87  11/24/22 126/86  07/12/22 133/80   Wt Readings from Last 3 Encounters:  11/30/23 127 lb 9.6 oz (57.9 kg)  11/24/22 134 lb (60.8 kg)  07/12/22 137 lb (62.1 kg)   Most recent fall risk assessment:    11/30/2023    9:25 AM  Fall Risk   Falls in the past year? 1  Number falls in past yr: 0  Injury with Fall? 0  Risk for fall due to : No Fall Risks  Follow up Falls evaluation completed;Education provided   Depression screen:Yes - No Depression  Most recent depression screenings:    11/30/2023    9:26 AM 11/24/2022   10:05 AM  PHQ 2/9 Scores  PHQ - 2 Score 0 2  PHQ- 9 Score 2 9    HPI  No problem-specific Assessment & Plan notes found for this encounter.   Past Medical History:  Diagnosis Date   Elevated BP without diagnosis of hypertension 11/16/2021   Past Surgical History:  Procedure Laterality Date   ADENOIDECTOMY     BREAST BIOPSY Left 12/24/2021   BREAST EXCISIONAL BIOPSY Left 02/25/2022   BREAST LUMPECTOMY WITH RADIOACTIVE SEED LOCALIZATION Left 02/25/2022   Procedure: LEFT BREAST LUMPECTOMY WITH RADIOACTIVE SEED LOCALIZATION;  Surgeon: Harriette Bouillon, MD;  Location: [facility];  Service: General;  Laterality: Left;   CESAREAN SECTION     COLONOSCOPY  11/26/2013   normal   TONSILLECTOMY     Social History    Socioeconomic History   Marital status: Divorced    Spouse name: Not on file   Number of children: 2   Years of education: Not on file   Highest education level: Master's degree (e.g., MA, MS, MEng, MEd, MSW, MBA)  Occupational History   Not on file  Tobacco Use   Smoking status: Never   Smokeless tobacco: Never  Vaping Use   Vaping status: Never Used  Substance and Sexual Activity   Alcohol use: No   Drug use: No   Sexual activity: Yes    Birth control/protection: Post-menopausal  Other Topics Concern   Not on file  Social History Narrative   Not on file   Social Determinants of Health   Financial Resource Strain: Low Risk  (11/29/2023)   Overall Financial Resource Strain (CARDIA)    Difficulty of Paying Living Expenses: Not hard at all  Food Insecurity: No Food Insecurity (11/29/2023)   Hunger Vital Sign    Worried About Running Out of Food in the Last Year: Never true    Ran Out of Food in the Last Year: Never true  Transportation Needs: No Transportation Needs (11/29/2023)   PRAPARE - Administrator, Civil Service (Medical): No    Lack of Transportation (Non-Medical): No  Physical Activity: Sufficiently Active (11/29/2023)  Exercise Vital Sign    Days of Exercise per Week: 4 days    Minutes of Exercise per Session: 120 min  Stress: No Stress Concern Present (11/29/2023)   [employer] of Occupational Health - Occupational Stress Questionnaire    Feeling of Stress : Only a little  Social Connections: Moderately Integrated (11/29/2023)   Social Connection and Isolation Panel [NHANES]    Frequency of Communication with Friends and Family: More than three times a week    Frequency of Social Gatherings with Friends and Family: More than three times a week    Attends Religious Services: More than 4 times per year    Active Member of [employer] or Organizations: Yes    Attends Banker Meetings: More than 4 times per year    Marital Status:  Divorced  Intimate Partner Violence: Not At Risk (05/31/2023)   Received from [facility]   HITS    Over the last 12 months how often did your partner physically hurt you?: Never    Over the last 12 months how often did your partner insult you or talk down to you?: Never    Over the last 12 months how often did your partner threaten you with physical harm?: Never    Over the last 12 months how often did your partner scream or curse at you?: Never   Family Status  Relation Name Status   Mother  Deceased   Father  Deceased   Sister  Alive   Daughter  Alive   Son  Alive  No partnership data on file   Family History  Problem Relation Age of Onset   Cancer Mother 55       colon   Cancer Father        lung   Allergies  Allergen Reactions   Other Anaphylaxis    All Tree Nuts, palm oil   Shellfish Allergy Anaphylaxis    All fish   Fish Allergy     Patient Care Team: Kaoru Rezendes, Bonna Gains, NP as PCP - General (Internal Medicine)   Medications: Outpatient Medications Prior to Visit  Medication Sig   cefadroxil (DURICEF) 500 MG capsule SMARTSIG:1.0 Capsule(s) By Mouth Twice Daily   EPINEPHrine 0.3 mg/0.3 mL IJ SOAJ injection Inject 0.3 mg into the muscle as needed for anaphylaxis.   ibuprofen (ADVIL) 800 MG tablet Take 1 tablet (800 mg total) by mouth every 8 (eight) hours as needed.   Multiple Vitamin (MULTIVITAMIN) tablet Take 1 tablet by mouth daily.   promethazine (PHENERGAN) 25 MG tablet Take by mouth.   [DISCONTINUED] oxyCODONE-acetaminophen (PERCOCET/ROXICET) 5-325 MG tablet SMARTSIG:1.0 Tablet(s) By Mouth 4 Times Daily PRN   [DISCONTINUED] diphenhydrAMINE (BENADRYL) 25 MG tablet Take 1 tablet (25 mg total) by mouth every 6 (six) hours as needed.   [DISCONTINUED] hydrocortisone (PROCTOSOL HC) 2.5 % rectal cream Place 1 application rectally 2 (two) times daily.   No facility-administered medications prior to visit.    Review of Systems  Constitutional:  Negative for  activity change, appetite change and unexpected weight change.  Respiratory: Negative.    Cardiovascular: Negative.   Gastrointestinal: Negative.   Endocrine: Negative for cold intolerance and heat intolerance.  Genitourinary: Negative.   Musculoskeletal: Negative.   Skin: Negative.   Neurological: Negative.   Hematological: Negative.   Psychiatric/Behavioral:  Negative for behavioral problems, decreased concentration, dysphoric mood, hallucinations, self-injury, sleep disturbance and suicidal ideas. The patient is not nervous/anxious.        Objective:  BP 127/87 (BP Location: Left Arm, Patient Position: Sitting, Cuff Size: Normal)   Pulse 80   Temp 98.4 F (36.9 C) (Temporal)   Resp 18   Ht 4\' 11"  (1.499 m)   Wt 127 lb 9.6 oz (57.9 kg)   LMP 02/24/2021 (Within Weeks)   SpO2 100%   BMI 25.77 kg/m     Physical Exam Vitals and nursing note reviewed. Exam conducted with a chaperone present.  Constitutional:      General: She is not in acute distress. HENT:     Right Ear: Tympanic membrane, ear canal and external ear normal.     Left Ear: Tympanic membrane, ear canal and external ear normal.     Nose: Nose normal.  Eyes:     Extraocular Movements: Extraocular movements intact.     Conjunctiva/sclera: Conjunctivae normal.     Pupils: Pupils are equal, round, and reactive to light.  Neck:     Thyroid: No thyroid mass, thyromegaly or thyroid tenderness.  Cardiovascular:     Rate and Rhythm: Normal rate and regular rhythm.     Pulses: Normal pulses.     Heart sounds: Normal heart sounds.  Pulmonary:     Effort: Pulmonary effort is normal.     Breath sounds: Normal breath sounds.  Chest:  Breasts:    Breasts are symmetrical.     Right: Normal.     Left: Normal.  Abdominal:     General: Bowel sounds are normal.     Palpations: Abdomen is soft.     Hernia: There is no hernia in the left inguinal area or right inguinal area.  Genitourinary:    General: Normal vulva.      Exam position: Lithotomy position.     Labia:        Right: No rash, tenderness or lesion.        Left: No rash, tenderness or lesion.      Urethra: No prolapse or urethral pain.     Vagina: Normal.     Cervix: Normal.     Uterus: Normal.      Adnexa: Right adnexa normal and left adnexa normal.  Musculoskeletal:        General: Normal range of motion.     Cervical back: Normal range of motion and neck supple.     Right lower leg: No edema.     Left lower leg: No edema.  Lymphadenopathy:     Cervical: No cervical adenopathy.     Upper Body:     Right upper body: No supraclavicular, axillary or pectoral adenopathy.     Left upper body: No supraclavicular, axillary or pectoral adenopathy.     Lower Body: No right inguinal adenopathy. No left inguinal adenopathy.  Skin:    General: Skin is warm and dry.  Neurological:     Mental Status: She is alert and oriented to person, place, and time.     Cranial Nerves: No cranial nerve deficit.  Psychiatric:        Mood and Affect: Mood normal.        Behavior: Behavior normal.        Thought Content: Thought content normal.      No results found for any visits on 11/30/23.    Assessment & Plan:    Routine Health Maintenance and Physical Exam  Immunization History  Administered Date(s) Administered   Influenza-Unspecified 10/22/2021, 09/27/2022, 10/13/2023   PFIZER(Purple Top)SARS-COV-2 Vaccination 02/23/2020, 03/15/2020, 09/30/2020   Tdap 09/13/2011, 11/16/2021  Zoster Recombinant(Shingrix) 11/16/2021, 02/16/2022   Health Maintenance  Topic Date Due   Colonoscopy  12/10/2018   COVID-19 Vaccine (4 - 2023-24 season) 03/26/2024 (Originally 08/28/2023)   Hepatitis C Screening  11/29/2024 (Originally 03/28/1986)   HIV Screening  11/29/2024 (Originally 03/29/1983)   MAMMOGRAM  11/15/2024   Cervical Cancer Screening (HPV/Pap Cotest)  11/16/2026   DTaP/Tdap/Td (3 - Td or Tdap) 11/17/2031   INFLUENZA VACCINE  Completed   Zoster  Vaccines- Shingrix  Completed   HPV VACCINES  Aged Out   Discussed health benefits of physical activity, and encouraged her to engage in regular exercise appropriate for her age and condition.  Problem List Items Addressed This Visit   None Visit Diagnoses     Encounter for preventative adult health care exam with abnormal findings    -  Primary   Relevant Orders   Comprehensive metabolic panel   Elevated LDL cholesterol level       Relevant Orders   Lipid panel   Impaired fasting glucose       Relevant Orders   Hemoglobin A1c      Return in about 1 year (around 11/29/2024) for CPE (fasting).     Alysia Penna, NP

## 2023-12-01 ENCOUNTER — Encounter: Payer: Self-pay | Admitting: Internal Medicine

## 2023-12-13 ENCOUNTER — Ambulatory Visit
Admission: RE | Admit: 2023-12-13 | Discharge: 2023-12-13 | Disposition: A | Discharge status: Home / Self Care | Payer: BC Managed Care – PPO | Source: Ambulatory Visit | Attending: Nurse Practitioner | Admitting: Nurse Practitioner

## 2023-12-13 DIAGNOSIS — Z1231 Encounter for screening mammogram for malignant neoplasm of breast: Secondary | ICD-10-CM

## 2023-12-19 ENCOUNTER — Ambulatory Visit (AMBULATORY_SURGERY_CENTER): Payer: BC Managed Care – PPO | Admitting: *Deleted

## 2023-12-19 VITALS — Ht 59.0 in | Wt 124.0 lb

## 2023-12-19 DIAGNOSIS — Z1211 Encounter for screening for malignant neoplasm of colon: Secondary | ICD-10-CM

## 2023-12-19 NOTE — Progress Notes (Signed)
Pt's name and DOB verified at the beginning of the pre-visit wit 2 identifiers  Pt denies any difficulty with ambulating,sitting, laying down or rolling side to side  Pt has no issues with ambulation   Pt has no issues moving head neck or swallowing  No egg or soy allergy known to patient   No issues known to pt with past sedation with any surgeries or procedures  Pt denies having issues being intubated  No FH of Malignant Hyperthermia  Pt is not on diet pills or shots  Pt is not on home 02   Pt is not on blood thinners   Pt denies issues with constipation   Pt is not on dialysis  Pt denise any abnormal heart rhythms   Pt denies any upcoming cardiac testing  Pt encouraged to use to use Singlecare or Goodrx to reduce cost   Patient's chart reviewed by Cathlyn Parsons CNRA prior to pre-visit and patient appropriate for the [facility].  Pre-visit completed and red dot placed by patient's name on their procedure day (on provider's schedule).  .  Visit by phone   Pt states weight is 124 lb   Instructed pt why it is important to and  to call if they have any changes in health or new medications. Directed them to the # given and on instructions.     Instructions reviewed. Pt given both [facility] main # and MD on call # prior to instructions.  Pt states understanding. Instructed to review again prior to procedure. Pt states they will.   Instructions sent by mail with coupon and by My Chart  Instructions and coupon given to pt   Instructions sent through My Chart  Coupon sent via text to mobile phone and pt verified they received it

## 2024-01-12 ENCOUNTER — Encounter: Payer: Self-pay | Admitting: Internal Medicine

## 2024-01-16 ENCOUNTER — Ambulatory Visit (AMBULATORY_SURGERY_CENTER): Payer: 59 | Admitting: Internal Medicine

## 2024-01-16 ENCOUNTER — Encounter: Payer: Self-pay | Admitting: Internal Medicine

## 2024-01-16 VITALS — BP 120/84 | HR 75 | Temp 97.3°F | Resp 14 | Ht 59.0 in | Wt 121.0 lb

## 2024-01-16 DIAGNOSIS — Z1211 Encounter for screening for malignant neoplasm of colon: Secondary | ICD-10-CM | POA: Diagnosis present

## 2024-01-16 DIAGNOSIS — Z800 Family history of malignant neoplasm of digestive organs: Secondary | ICD-10-CM

## 2024-01-16 DIAGNOSIS — D122 Benign neoplasm of ascending colon: Secondary | ICD-10-CM | POA: Diagnosis not present

## 2024-01-16 DIAGNOSIS — K648 Other hemorrhoids: Secondary | ICD-10-CM | POA: Diagnosis not present

## 2024-01-16 MED ORDER — SODIUM CHLORIDE 0.9 % IV SOLN: 500.0000 mL | Freq: Once | INTRAVENOUS | Status: DC

## 2024-01-16 NOTE — Progress Notes (Signed)
Vss nad trans to pacu 

## 2024-01-16 NOTE — Progress Notes (Signed)
Pt's states no medical or surgical changes since previsit or office visit. 

## 2024-01-16 NOTE — Progress Notes (Signed)
GASTROENTEROLOGY PROCEDURE H&P NOTE   Primary Care Physician: Anne Ng, NP    Reason for Procedure:   Colon cancer screening  Plan:    Colonoscopy  Patient is appropriate for endoscopic procedure(s) in the ambulatory ([facility]) setting.  The nature of the procedure, as well as the risks, benefits, and alternatives were carefully and thoroughly reviewed with the patient. Ample time for discussion and questions allowed. The patient understood, was satisfied, and agreed to proceed.     HPI: Mary Golden is a 58 y.o. female who presents for colonoscopy for colon cancer screening. Denies blood in stools, changes in bowel habits, or unintentional weight loss. Mother had colon cancer at age 30. Last colonoscopy was in 2014 that was normal.  Past Medical History:  Diagnosis Date   Allergy    Elevated BP without diagnosis of hypertension 11/16/2021    Past Surgical History:  Procedure Laterality Date   ADENOIDECTOMY     BREAST BIOPSY Left 12/24/2021   BREAST EXCISIONAL BIOPSY Left 02/25/2022   BREAST LUMPECTOMY WITH RADIOACTIVE SEED LOCALIZATION Left 02/25/2022   Procedure: LEFT BREAST LUMPECTOMY WITH RADIOACTIVE SEED LOCALIZATION;  Surgeon: Harriette Bouillon, MD;  Location: [facility];  Service: General;  Laterality: Left;   CESAREAN SECTION     COLONOSCOPY  11/26/2013   normal   TONSILLECTOMY      Prior to Admission medications   Medication Sig Start Date End Date Taking? Authorizing Provider  Multiple Vitamin (MULTIVITAMIN) tablet Take 1 tablet by mouth daily.   Yes [provider]  cefadroxil (DURICEF) 500 MG capsule SMARTSIG:1.0 Capsule(s) By Mouth Twice Daily Patient not taking: Reported on 01/16/2024 07/15/23   [provider]  EPINEPHrine 0.3 mg/0.3 mL IJ SOAJ injection Inject 0.3 mg into the muscle as needed for anaphylaxis. 07/12/22   Petrucelli, Samantha R, PA-C  ibuprofen (ADVIL) 800 MG tablet Take 1 tablet (800 mg total) by  mouth every 8 (eight) hours as needed. Patient not taking: Reported on 01/16/2024 02/25/22   Harriette Bouillon, MD  promethazine (PHENERGAN) 25 MG tablet Take by mouth. Patient not taking: Reported on 01/16/2024 07/15/23   [provider]    Current Outpatient Medications  Medication Sig Dispense Refill   Multiple Vitamin (MULTIVITAMIN) tablet Take 1 tablet by mouth daily.     cefadroxil (DURICEF) 500 MG capsule SMARTSIG:1.0 Capsule(s) By Mouth Twice Daily (Patient not taking: Reported on 01/16/2024)     EPINEPHrine 0.3 mg/0.3 mL IJ SOAJ injection Inject 0.3 mg into the muscle as needed for anaphylaxis. 1 each 1   ibuprofen (ADVIL) 800 MG tablet Take 1 tablet (800 mg total) by mouth every 8 (eight) hours as needed. (Patient not taking: Reported on 01/16/2024) 30 tablet 0   promethazine (PHENERGAN) 25 MG tablet Take by mouth. 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    Family History  Problem Relation Age of Onset   Colon cancer Mother    Cancer Mother 78       colon   Cancer Father        lung  Colon polyps Neg Hx    Esophageal cancer Neg Hx    Rectal cancer Neg Hx    Prostate cancer Neg Hx     Social History   Socioeconomic History   Marital status: Divorced    Spouse name: Not on file   Number of children: 2   Years of education: Not on file   Highest education level: Master's degree (e.g., MA, MS, MEng, MEd, MSW, MBA)  Occupational History   Not on file  Tobacco Use   Smoking status: Never   Smokeless tobacco: Never  Vaping Use   Vaping status: Never Used   Substance and Sexual Activity   Alcohol use: No   Drug use: No   Sexual activity: Yes    Birth control/protection: Post-menopausal  Other Topics Concern   Not on file  Social History Narrative   Not on file   Social Drivers of Health   Financial Resource Strain: Low Risk  (11/29/2023)   Overall Financial Resource Strain (CARDIA)    Difficulty of Paying Living Expenses: Not hard at all  Food Insecurity: No Food Insecurity (11/29/2023)   Hunger Vital Sign    Worried About Running Out of Food in the Last Year: Never true    Ran Out of Food in the Last Year: Never true  Transportation Needs: No Transportation Needs (11/29/2023)   PRAPARE - Administrator, Civil Service (Medical): No    Lack of Transportation (Non-Medical): No  Physical Activity: Sufficiently Active (11/29/2023)   Exercise Vital Sign    Days of Exercise per Week: 4 days    Minutes of Exercise per Session: 120 min  Stress: No Stress Concern Present (11/29/2023)   [employer] of Occupational Health - Occupational Stress Questionnaire    Feeling of Stress : Only a little  Social Connections: Moderately Integrated (11/29/2023)   Social Connection and Isolation Panel [NHANES]    Frequency of Communication with Friends and Family: More than three times a week    Frequency of Social Gatherings with Friends and Family: More than three times a week    Attends Religious Services: More than 4 times per year    Active Member of [employer] or Organizations: Yes    Attends Engineer, structural: More than 4 times per year    Marital Status: Divorced  Intimate Partner Violence: Not At Risk (05/31/2023)   Received from [facility]   HITS    Over the last 12 months how often did your partner physically hurt you?: Never    Over the last 12 months how often did your partner insult you or talk down to you?: Never    Over the last 12 months how often did your partner threaten you with physical harm?: Never    Over  the last 12 months how often did your partner scream or curse at you?: Never    Physical Exam: Vital signs in last 24 hours: BP 113/77   Pulse 93   Temp (!) 97.3 F (36.3 C) (Temporal)   Ht 4\' 11"  (1.499 m)   Wt 121 lb (54.9 kg)   LMP 02/24/2021 (Within Weeks)   SpO2 99%   BMI 24.44 kg/m  GEN: NAD EYE: Sclerae anicteric ENT: MMM CV: Non-tachycardic Pulm: No increased work of breathing GI: Soft, NT/ND NEURO:  Alert & Oriented   Eulah Pont, MD [facility]  01/16/2024 2:06 PM

## 2024-01-16 NOTE — Patient Instructions (Addendum)
Resume previous diet Continue present medications Await pathology results Handouts/information given for polyps and hemorrhoids  YOU HAD AN ENDOSCOPIC PROCEDURE TODAY AT THE [facility]:   Refer to the procedure report that was given to you for any specific questions about what was found during the examination.  If the procedure report does not answer your questions, please call your gastroenterologist to clarify.  If you requested that your care partner not be given the details of your procedure findings, then the procedure report has been included in a sealed envelope for you to review at your convenience later.  YOU SHOULD EXPECT: Some feelings of bloating in the abdomen. Passage of more gas than usual.  Walking can help get rid of the air that was put into your GI tract during the procedure and reduce the bloating. If you had a lower endoscopy (such as a colonoscopy or flexible sigmoidoscopy) you may notice spotting of blood in your stool or on the toilet paper. If you underwent a bowel prep for your procedure, you may not have a normal bowel movement for a few days.  Please Note:  You might notice some irritation and congestion in your nose or some drainage.  This is from the oxygen used during your procedure.  There is no need for concern and it should clear up in a day or so.  SYMPTOMS TO REPORT IMMEDIATELY:  Following lower endoscopy (colonoscopy):  Excessive amounts of blood in the stool  Significant tenderness or worsening of abdominal pains  Swelling of the abdomen that is new, acute  Fever of 100F or higher  For urgent or emergent issues, a gastroenterologist can be reached at any hour by calling [phone]. Do not use MyChart messaging for urgent concerns.   DIET:  We do recommend a small meal at first, but then you may proceed to your regular diet.  Drink plenty of fluids but you should avoid alcoholic beverages for 24 hours.  ACTIVITY:  You should plan to take  it easy for the rest of today and you should NOT DRIVE or use heavy machinery until tomorrow (because of the sedation medicines used during the test).    FOLLOW UP: Our staff will call the number listed on your records the next business day following your procedure.  We will call around 7:15- 8:00 am to check on you and address any questions or concerns that you may have regarding the information given to you following your procedure. If we do not reach you, we will leave a message.     If any biopsies were taken you will be contacted by phone or by letter within the next 1-3 weeks.  Please call us at [phone] if you have not heard about the biopsies in 3 weeks.    SIGNATURES/CONFIDENTIALITY: You and/or your care partner have signed paperwork which will be entered into your electronic medical record.  These signatures attest to the fact that that the information above on your After Visit Summary has been reviewed and is understood.  Full responsibility of the confidentiality of this discharge information lies with you and/or your care-partner. 

## 2024-01-16 NOTE — Op Note (Signed)
[facility] Patient Name: Mary Golden Procedure Date: 01/16/2024 2:31 PM MRN: [id number] Endoscopist: Particia Lather , , [id number] Age: 58 Referring MD:  Date of Birth: October 05, 1966 Gender: Female Account #: [id number] Procedure:                Colonoscopy Indications:              Screening patient at increased risk: Family history                            of 1st-degree relative with colorectal cancer at                            age 23 years (or older) Medicines:                Monitored Anesthesia Care Procedure:                Pre-Anesthesia Assessment:                           - Prior to the procedure, a History and Physical                            was performed, and patient medications and                            allergies were reviewed. The patient's tolerance of                            previous anesthesia was also reviewed. The risks                            and benefits of the procedure and the sedation                            options and risks were discussed with the patient.                            All questions were answered, and informed consent                            was obtained. Prior Anticoagulants: The patient has                            taken no anticoagulant or antiplatelet agents. ASA                            Grade Assessment: II - A patient with mild systemic                            disease. After reviewing the risks and benefits,                            the patient was deemed in satisfactory condition to  undergo the procedure.                           After obtaining informed consent, the colonoscope                            was passed under direct vision. Throughout the                            procedure, the patient's blood pressure, pulse, and                            oxygen saturations were monitored continuously. The                            Olympus Scope [id number] was  introduced through the                            anus and advanced to the the terminal ileum. The                            colonoscopy was performed without difficulty. The                            patient tolerated the procedure well. The quality                            of the bowel preparation was good. The terminal                            ileum, ileocecal valve, appendiceal orifice, and                            rectum were photographed. Scope In: 2:44:10 PM Scope Out: 2:59:21 PM Scope Withdrawal Time: 0 hours 12 minutes 18 seconds  Total Procedure Duration: 0 hours 15 minutes 11 seconds  Findings:                 The terminal ileum appeared normal.                           A 3 mm polyp was found in the ascending colon. The                            polyp was sessile. The polyp was removed with a                            cold snare. Resection and retrieval were complete.                           Non-bleeding internal hemorrhoids were found during                            retroflexion. Complications:  No immediate complications. Estimated Blood Loss:     Estimated blood loss was minimal. Impression:               - The examined portion of the ileum was normal.                           - One 3 mm polyp in the ascending colon, removed                            with a cold snare. Resected and retrieved.                           - Non-bleeding internal hemorrhoids. Recommendation:           - Discharge patient to home (with escort).                           - Await pathology results.                           - The findings and recommendations were discussed                            with the patient. Dr Particia Lather "[name]" [name],  01/16/2024 3:01:37 PM

## 2024-01-17 ENCOUNTER — Telehealth: Payer: Self-pay | Admitting: *Deleted

## 2024-01-17 NOTE — Telephone Encounter (Signed)
  Follow up Call-     01/16/2024    1:47 PM  Call back number  Post procedure Call Back phone  # [phone]  Permission to leave phone message Yes    Post procedure follow up phone call. No answer at number given.  Left message on voicemail.

## 2024-01-19 LAB — SURGICAL PATHOLOGY

## 2024-01-24 ENCOUNTER — Encounter: Payer: Self-pay | Admitting: Internal Medicine

## 2024-10-30 ENCOUNTER — Other Ambulatory Visit: Payer: Self-pay | Admitting: Nurse Practitioner

## 2024-10-30 DIAGNOSIS — Z1231 Encounter for screening mammogram for malignant neoplasm of breast: Secondary | ICD-10-CM

## 2024-12-13 ENCOUNTER — Inpatient Hospital Stay: Admission: RE | Admit: 2024-12-13 | Discharge: 2024-12-13 | Attending: Nurse Practitioner

## 2024-12-13 DIAGNOSIS — Z1231 Encounter for screening mammogram for malignant neoplasm of breast: Secondary | ICD-10-CM
